# Patient Record
Sex: Female | Born: 1980
Health system: Southern US, Community
[De-identification: ages and names within clinical notes are randomized; demographics above are authoritative.]

---

## 2013-05-01 ENCOUNTER — Ambulatory Visit (INDEPENDENT_AMBULATORY_CARE_PROVIDER_SITE_OTHER): Payer: 59 | Admitting: Obstetrics & Gynecology

## 2013-05-01 ENCOUNTER — Encounter: Payer: Self-pay | Admitting: Obstetrics & Gynecology

## 2013-05-01 VITALS — BP 132/75 | HR 85 | Resp 16 | Ht 65.0 in | Wt 130.0 lb

## 2013-05-01 DIAGNOSIS — Z1151 Encounter for screening for human papillomavirus (HPV): Secondary | ICD-10-CM

## 2013-05-01 DIAGNOSIS — Z113 Encounter for screening for infections with a predominantly sexual mode of transmission: Secondary | ICD-10-CM

## 2013-05-01 DIAGNOSIS — Z124 Encounter for screening for malignant neoplasm of cervix: Secondary | ICD-10-CM

## 2013-05-01 DIAGNOSIS — Z0000 Encounter for general adult medical examination without abnormal findings: Secondary | ICD-10-CM

## 2013-05-01 DIAGNOSIS — Z01419 Encounter for gynecological examination (general) (routine) without abnormal findings: Secondary | ICD-10-CM
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
## 2014-01-21 ENCOUNTER — Encounter (HOSPITAL_COMMUNITY): Payer: Self-pay | Admitting: *Deleted

## 2014-01-21 ENCOUNTER — Encounter (HOSPITAL_COMMUNITY)
Admission: AD | Disposition: A | Discharge status: Home / Self Care | Payer: Self-pay | Source: Ambulatory Visit | Attending: Obstetrics and Gynecology

## 2014-01-21 ENCOUNTER — Encounter (HOSPITAL_COMMUNITY): Payer: 59 | Admitting: Anesthesiology

## 2014-01-21 ENCOUNTER — Inpatient Hospital Stay (HOSPITAL_COMMUNITY)
Admission: AD | Admit: 2014-01-21 | Discharge: 2014-01-24 | DRG: 765 | Disposition: A | Discharge status: Home / Self Care | Payer: 59 | Source: Ambulatory Visit | Attending: Obstetrics and Gynecology | Admitting: Obstetrics and Gynecology

## 2014-01-21 ENCOUNTER — Inpatient Hospital Stay (HOSPITAL_COMMUNITY): Payer: 59 | Admitting: Anesthesiology

## 2014-01-21 DIAGNOSIS — D649 Anemia, unspecified: Secondary | ICD-10-CM | POA: Diagnosis present

## 2014-01-21 DIAGNOSIS — O468X9 Other antepartum hemorrhage, unspecified trimester: Secondary | ICD-10-CM | POA: Diagnosis present

## 2014-01-21 DIAGNOSIS — O4590 Premature separation of placenta, unspecified, unspecified trimester: Principal | ICD-10-CM | POA: Diagnosis present

## 2014-01-21 DIAGNOSIS — O43899 Other placental disorders, unspecified trimester: Secondary | ICD-10-CM

## 2014-01-21 DIAGNOSIS — Z98891 History of uterine scar from previous surgery: Secondary | ICD-10-CM

## 2014-01-21 DIAGNOSIS — O368990 Maternal care for other specified fetal problems, unspecified trimester, not applicable or unspecified: Secondary | ICD-10-CM | POA: Diagnosis present

## 2014-01-21 DIAGNOSIS — O9903 Anemia complicating the puerperium: Secondary | ICD-10-CM | POA: Diagnosis present

## 2014-01-21 LAB — RPR: RPR Ser Ql: NONREACTIVE

## 2014-01-21 LAB — CBC
HCT: 35.1 % — ABNORMAL LOW (ref 36.0–46.0)
Hemoglobin: 12.2 g/dL (ref 12.0–15.0)
MCH: 28.9 pg (ref 26.0–34.0)
MCHC: 34.8 g/dL (ref 30.0–36.0)
MCV: 83.2 fL (ref 78.0–100.0)
PLATELETS: 279 10*3/uL (ref 150–400)
RBC: 4.22 MIL/uL (ref 3.87–5.11)
RDW: 12.6 % (ref 11.5–15.5)
WBC: 12.2 10*3/uL — AB (ref 4.0–10.5)

## 2014-01-21 LAB — TYPE AND SCREEN
ABO/RH(D): O POS
Antibody Screen: NEGATIVE

## 2014-01-21 LAB — DIC (DISSEMINATED INTRAVASCULAR COAGULATION) PANEL
D DIMER QUANT: 1.66 ug{FEU}/mL — AB (ref 0.00–0.48)
FIBRINOGEN: 440 mg/dL (ref 204–475)
PLATELETS: 283 10*3/uL (ref 150–400)
PROTHROMBIN TIME: 12.7 s (ref 11.6–15.2)
SMEAR REVIEW: NONE SEEN

## 2014-01-21 LAB — DIC (DISSEMINATED INTRAVASCULAR COAGULATION)PANEL
INR: 0.97 (ref 0.00–1.49)
aPTT: 25 seconds (ref 24–37)

## 2014-01-21 LAB — ABO/RH: ABO/RH(D): O POS

## 2014-01-21 SURGERY — Surgical Case
Anesthesia: Spinal | Site: Abdomen

## 2014-01-21 MED ORDER — ONDANSETRON HCL 4 MG PO TABS: 4.0000 mg | ORAL_TABLET | ORAL | Status: DC | PRN

## 2014-01-21 MED ORDER — PROMETHAZINE HCL 25 MG/ML IJ SOLN
12.5000 mg | Freq: Once | INTRAMUSCULAR | Status: AC
  Administered 2014-01-21: 12.5 mg via INTRAVENOUS

## 2014-01-21 MED ORDER — PRENATAL MULTIVITAMIN CH
1.0000 | ORAL_TABLET | Freq: Every day | ORAL | Status: DC
  Administered 2014-01-22 – 2014-01-24 (×3): 1 via ORAL
  Filled 2014-01-21 (×3): qty 1

## 2014-01-21 MED ORDER — KETOROLAC TROMETHAMINE 30 MG/ML IJ SOLN
INTRAMUSCULAR | Status: AC
Start: 2014-01-21 — End: 2014-01-22
  Filled 2014-01-21: qty 1

## 2014-01-21 MED ORDER — ONDANSETRON HCL 4 MG/2ML IJ SOLN
INTRAMUSCULAR | Status: AC
  Filled 2014-01-21: qty 2

## 2014-01-21 MED ORDER — ONDANSETRON HCL 4 MG/2ML IJ SOLN
INTRAMUSCULAR | Status: DC | PRN
  Administered 2014-01-21: 4 mg via INTRAVENOUS

## 2014-01-21 MED ORDER — MORPHINE SULFATE 0.5 MG/ML IJ SOLN
INTRAMUSCULAR | Status: AC
  Filled 2014-01-21: qty 10

## 2014-01-21 MED ORDER — FENTANYL CITRATE 0.05 MG/ML IJ SOLN: 25.0000 ug | INTRAMUSCULAR | Status: DC | PRN

## 2014-01-21 MED ORDER — TETANUS-DIPHTH-ACELL PERTUSSIS 5-2.5-18.5 LF-MCG/0.5 IM SUSP: 0.5000 mL | Freq: Once | INTRAMUSCULAR | Status: DC

## 2014-01-21 MED ORDER — FENTANYL CITRATE 0.05 MG/ML IJ SOLN
INTRAMUSCULAR | Status: AC
  Filled 2014-01-21: qty 2

## 2014-01-21 MED ORDER — OXYTOCIN 40 UNITS IN LACTATED RINGERS INFUSION - SIMPLE MED: 62.5000 mL/h | INTRAVENOUS | Status: AC

## 2014-01-21 MED ORDER — SCOPOLAMINE 1 MG/3DAYS TD PT72
1.0000 | MEDICATED_PATCH | Freq: Once | TRANSDERMAL | Status: DC
  Administered 2014-01-21: 1.5 mg via TRANSDERMAL

## 2014-01-21 MED ORDER — SIMETHICONE 80 MG PO CHEW
80.0000 mg | CHEWABLE_TABLET | Freq: Three times a day (TID) | ORAL | Status: DC
  Administered 2014-01-22 – 2014-01-24 (×7): 80 mg via ORAL
  Filled 2014-01-21 (×7): qty 1

## 2014-01-21 MED ORDER — MORPHINE SULFATE (PF) 0.5 MG/ML IJ SOLN
INTRAMUSCULAR | Status: DC | PRN
  Administered 2014-01-21: 15 mg via INTRATHECAL

## 2014-01-21 MED ORDER — MENTHOL 3 MG MT LOZG: 1.0000 | LOZENGE | OROMUCOSAL | Status: DC | PRN

## 2014-01-21 MED ORDER — DIPHENHYDRAMINE HCL 50 MG/ML IJ SOLN: 12.5000 mg | INTRAMUSCULAR | Status: DC | PRN

## 2014-01-21 MED ORDER — LACTATED RINGERS IV BOLUS (SEPSIS)
1000.0000 mL | Freq: Once | INTRAVENOUS | Status: AC
Start: 2014-01-21 — End: 2014-01-21
  Administered 2014-01-21: 1000 mL via INTRAVENOUS

## 2014-01-21 MED ORDER — OXYTOCIN 40 UNITS IN LACTATED RINGERS INFUSION - SIMPLE MED
INTRAVENOUS | Status: DC | PRN
  Administered 2014-01-21: 40 [IU] via INTRAVENOUS

## 2014-01-21 MED ORDER — FENTANYL CITRATE 0.05 MG/ML IJ SOLN
INTRAMUSCULAR | Status: DC | PRN
Start: 2014-01-21 — End: 2014-01-21
  Administered 2014-01-21: 25 ug via INTRATHECAL

## 2014-01-21 MED ORDER — SCOPOLAMINE 1 MG/3DAYS TD PT72
MEDICATED_PATCH | TRANSDERMAL | Status: AC
  Filled 2014-01-21: qty 1

## 2014-01-21 MED ORDER — KETOROLAC TROMETHAMINE 30 MG/ML IJ SOLN: 30.0000 mg | Freq: Four times a day (QID) | INTRAMUSCULAR | Status: DC | PRN

## 2014-01-21 MED ORDER — ONDANSETRON HCL 4 MG/2ML IJ SOLN
4.0000 mg | INTRAMUSCULAR | Status: DC | PRN
  Administered 2014-01-21: 4 mg via INTRAVENOUS

## 2014-01-21 MED ORDER — LACTATED RINGERS IV SOLN
INTRAVENOUS | Status: DC | PRN
Start: 2014-01-21 — End: 2014-01-21
  Administered 2014-01-21 (×2): via INTRAVENOUS

## 2014-01-21 MED ORDER — BETAMETHASONE SOD PHOS & ACET 6 (3-3) MG/ML IJ SUSP
12.0000 mg | Freq: Once | INTRAMUSCULAR | Status: AC
  Administered 2014-01-21: 12 mg via INTRAMUSCULAR
  Filled 2014-01-21: qty 2

## 2014-01-21 MED ORDER — PHENYLEPHRINE 8 MG IN D5W 100 ML (0.08MG/ML) PREMIX OPTIME
INJECTION | INTRAVENOUS | Status: AC
  Filled 2014-01-21: qty 100

## 2014-01-21 MED ORDER — OXYTOCIN 10 UNIT/ML IJ SOLN
INTRAMUSCULAR | Status: AC
  Filled 2014-01-21: qty 4

## 2014-01-21 MED ORDER — CITRIC ACID-SODIUM CITRATE 334-500 MG/5ML PO SOLN
ORAL | Status: DC | PRN
  Administered 2014-01-21: 30 mL via ORAL

## 2014-01-21 MED ORDER — NIFEDIPINE 10 MG PO CAPS: 10.0000 mg | ORAL_CAPSULE | Freq: Once | ORAL | Status: DC

## 2014-01-21 MED ORDER — ZOLPIDEM TARTRATE 5 MG PO TABS: 5.0000 mg | ORAL_TABLET | Freq: Every evening | ORAL | Status: DC | PRN

## 2014-01-21 MED ORDER — CEFAZOLIN SODIUM-DEXTROSE 2-3 GM-% IV SOLR
INTRAVENOUS | Status: AC
  Filled 2014-01-21: qty 50

## 2014-01-21 MED ORDER — MEPERIDINE HCL 25 MG/ML IJ SOLN
INTRAMUSCULAR | Status: AC
Start: 2014-01-21 — End: 2014-01-22
  Filled 2014-01-21: qty 1

## 2014-01-21 MED ORDER — LACTATED RINGERS IV SOLN: INTRAVENOUS | Status: DC

## 2014-01-21 MED ORDER — LACTATED RINGERS IV BOLUS (SEPSIS): 1000.0000 mL | Freq: Once | INTRAVENOUS | Status: DC

## 2014-01-21 MED ORDER — BUPIVACAINE IN DEXTROSE 0.75-8.25 % IT SOLN
INTRATHECAL | Status: DC | PRN
  Administered 2014-01-21: 1.6 mL via INTRATHECAL

## 2014-01-21 MED ORDER — IBUPROFEN 600 MG PO TABS
600.0000 mg | ORAL_TABLET | Freq: Four times a day (QID) | ORAL | Status: DC
  Administered 2014-01-22 – 2014-01-24 (×9): 600 mg via ORAL
  Filled 2014-01-21 (×9): qty 1

## 2014-01-21 MED ORDER — ONDANSETRON HCL 4 MG/2ML IJ SOLN: 4.0000 mg | Freq: Three times a day (TID) | INTRAMUSCULAR | Status: DC | PRN

## 2014-01-21 MED ORDER — NALOXONE HCL 0.4 MG/ML IJ SOLN: 0.4000 mg | INTRAMUSCULAR | Status: DC | PRN

## 2014-01-21 MED ORDER — DIPHENHYDRAMINE HCL 25 MG PO CAPS: 25.0000 mg | ORAL_CAPSULE | ORAL | Status: DC | PRN

## 2014-01-21 MED ORDER — NALBUPHINE HCL 10 MG/ML IJ SOLN: 5.0000 mg | INTRAMUSCULAR | Status: DC | PRN

## 2014-01-21 MED ORDER — PROMETHAZINE HCL 25 MG/ML IJ SOLN
INTRAMUSCULAR | Status: AC
  Filled 2014-01-21: qty 1

## 2014-01-21 MED ORDER — PHENYLEPHRINE 40 MCG/ML (10ML) SYRINGE FOR IV PUSH (FOR BLOOD PRESSURE SUPPORT)
PREFILLED_SYRINGE | INTRAVENOUS | Status: AC
  Filled 2014-01-21: qty 5

## 2014-01-21 MED ORDER — OXYCODONE-ACETAMINOPHEN 5-325 MG PO TABS: 1.0000 | ORAL_TABLET | ORAL | Status: DC | PRN

## 2014-01-21 MED ORDER — CITRIC ACID-SODIUM CITRATE 334-500 MG/5ML PO SOLN
ORAL | Status: AC
  Filled 2014-01-21: qty 15

## 2014-01-21 MED ORDER — WITCH HAZEL-GLYCERIN EX PADS: 1.0000 "application " | MEDICATED_PAD | CUTANEOUS | Status: DC | PRN

## 2014-01-21 MED ORDER — METOCLOPRAMIDE HCL 5 MG/ML IJ SOLN
INTRAMUSCULAR | Status: AC
  Filled 2014-01-21: qty 2

## 2014-01-21 MED ORDER — CEFAZOLIN SODIUM-DEXTROSE 2-3 GM-% IV SOLR
2.0000 g | Freq: Once | INTRAVENOUS | Status: AC
  Administered 2014-01-21: 2 g via INTRAVENOUS

## 2014-01-21 MED ORDER — DIPHENHYDRAMINE HCL 25 MG PO CAPS: 25.0000 mg | ORAL_CAPSULE | Freq: Four times a day (QID) | ORAL | Status: DC | PRN

## 2014-01-21 MED ORDER — SENNOSIDES-DOCUSATE SODIUM 8.6-50 MG PO TABS
2.0000 | ORAL_TABLET | ORAL | Status: DC
  Administered 2014-01-21 – 2014-01-22 (×2): 2 via ORAL
  Filled 2014-01-21 (×3): qty 2

## 2014-01-21 MED ORDER — SCOPOLAMINE 1 MG/3DAYS TD PT72: 1.0000 | MEDICATED_PATCH | Freq: Once | TRANSDERMAL | Status: DC

## 2014-01-21 MED ORDER — METOCLOPRAMIDE HCL 5 MG/ML IJ SOLN
10.0000 mg | Freq: Three times a day (TID) | INTRAMUSCULAR | Status: DC | PRN
  Administered 2014-01-21: 10 mg via INTRAVENOUS

## 2014-01-21 MED ORDER — ONDANSETRON HCL 4 MG/2ML IJ SOLN
INTRAMUSCULAR | Status: AC
Start: 2014-01-21 — End: 2014-01-22
  Filled 2014-01-21: qty 2

## 2014-01-21 MED ORDER — MEPERIDINE HCL 25 MG/ML IJ SOLN
6.2500 mg | INTRAMUSCULAR | Status: DC | PRN
Start: 2014-01-21 — End: 2014-01-21
  Administered 2014-01-21: 6.25 mg via INTRAVENOUS

## 2014-01-21 MED ORDER — SIMETHICONE 80 MG PO CHEW
80.0000 mg | CHEWABLE_TABLET | ORAL | Status: DC | PRN
  Filled 2014-01-21: qty 1

## 2014-01-21 MED ORDER — LANOLIN HYDROUS EX OINT: 1.0000 "application " | TOPICAL_OINTMENT | CUTANEOUS | Status: DC | PRN

## 2014-01-21 MED ORDER — KETOROLAC TROMETHAMINE 30 MG/ML IJ SOLN
30.0000 mg | Freq: Four times a day (QID) | INTRAMUSCULAR | Status: DC | PRN
  Administered 2014-01-21: 30 mg via INTRAVENOUS

## 2014-01-21 MED ORDER — SIMETHICONE 80 MG PO CHEW
80.0000 mg | CHEWABLE_TABLET | ORAL | Status: DC
Start: 2014-01-22 — End: 2014-01-24
  Administered 2014-01-21 – 2014-01-23 (×3): 80 mg via ORAL
  Filled 2014-01-21 (×3): qty 1

## 2014-01-21 MED ORDER — DIPHENHYDRAMINE HCL 50 MG/ML IJ SOLN: 25.0000 mg | INTRAMUSCULAR | Status: DC | PRN

## 2014-01-21 MED ORDER — SODIUM CHLORIDE 0.9 % IJ SOLN: 3.0000 mL | INTRAMUSCULAR | Status: DC | PRN

## 2014-01-21 MED ORDER — DIBUCAINE 1 % RE OINT: 1.0000 "application " | TOPICAL_OINTMENT | RECTAL | Status: DC | PRN

## 2014-01-21 MED ORDER — KETOROLAC TROMETHAMINE 30 MG/ML IJ SOLN
30.0000 mg | Freq: Four times a day (QID) | INTRAMUSCULAR | Status: DC | PRN
Start: 2014-01-21 — End: 2014-01-21

## 2014-01-21 MED ORDER — DEXTROSE 5 % IV SOLN
1.0000 ug/kg/h | INTRAVENOUS | Status: DC | PRN
  Filled 2014-01-21: qty 2

## 2014-01-21 MED ORDER — PHENYLEPHRINE 8 MG IN D5W 100 ML (0.08MG/ML) PREMIX OPTIME
INJECTION | INTRAVENOUS | Status: DC | PRN
  Administered 2014-01-21: 60 ug/min via INTRAVENOUS

## 2014-01-21 MED ORDER — LACTATED RINGERS IV SOLN
INTRAVENOUS | Status: DC | PRN
Start: 2014-01-21 — End: 2014-01-21
  Administered 2014-01-21: 18:00:00 via INTRAVENOUS

## 2014-01-21 SURGICAL SUPPLY — 34 items
BENZOIN TINCTURE PRP APPL 2/3 (GAUZE/BANDAGES/DRESSINGS) ×2 IMPLANT
CLAMP CORD UMBIL (MISCELLANEOUS) IMPLANT
CLOTH BEACON ORANGE TIMEOUT ST (SAFETY) ×2 IMPLANT
CONTAINER PREFILL 10% NBF 15ML (MISCELLANEOUS) IMPLANT
DERMABOND ADVANCED (GAUZE/BANDAGES/DRESSINGS) ×1
DERMABOND ADVANCED .7 DNX12 (GAUZE/BANDAGES/DRESSINGS) ×1 IMPLANT
DRAPE LG THREE QUARTER DISP (DRAPES) IMPLANT
DRSG OPSITE POSTOP 4X10 (GAUZE/BANDAGES/DRESSINGS) ×2 IMPLANT
DURAPREP 26ML APPLICATOR (WOUND CARE) ×2 IMPLANT
ELECT REM PT RETURN 9FT ADLT (ELECTROSURGICAL) ×2
ELECTRODE REM PT RTRN 9FT ADLT (ELECTROSURGICAL) ×1 IMPLANT
EXTRACTOR VACUUM M CUP 4 TUBE (SUCTIONS) IMPLANT
GLOVE BIO SURGEON STRL SZ7.5 (GLOVE) ×2 IMPLANT
GLOVE BIOGEL PI IND STRL 7.5 (GLOVE) ×1 IMPLANT
GLOVE BIOGEL PI INDICATOR 7.5 (GLOVE) ×1
GOWN STRL REUS W/TWL LRG LVL3 (GOWN DISPOSABLE) ×4 IMPLANT
KIT ABG SYR 3ML LUER SLIP (SYRINGE) IMPLANT
NEEDLE HYPO 25X5/8 SAFETYGLIDE (NEEDLE) IMPLANT
NS IRRIG 1000ML POUR BTL (IV SOLUTION) ×2 IMPLANT
PACK C SECTION WH (CUSTOM PROCEDURE TRAY) ×2 IMPLANT
PAD OB MATERNITY 4.3X12.25 (PERSONAL CARE ITEMS) ×2 IMPLANT
RETRACTOR WND ALEXIS 25 LRG (MISCELLANEOUS) ×1 IMPLANT
RTRCTR WOUND ALEXIS 25CM LRG (MISCELLANEOUS) ×2
STRIP CLOSURE SKIN 1/2X4 (GAUZE/BANDAGES/DRESSINGS) ×2 IMPLANT
SUT CHROMIC 2 0 CT 1 (SUTURE) ×2 IMPLANT
SUT MNCRL AB 3-0 PS2 27 (SUTURE) ×2 IMPLANT
SUT PLAIN 0 NONE (SUTURE) IMPLANT
SUT PLAIN 2 0 XLH (SUTURE) ×2 IMPLANT
SUT VIC AB 0 CT1 36 (SUTURE) ×2 IMPLANT
SUT VIC AB 0 CTX 36 (SUTURE) ×3
SUT VIC AB 0 CTX36XBRD ANBCTRL (SUTURE) ×3 IMPLANT
TOWEL OR 17X24 6PK STRL BLUE (TOWEL DISPOSABLE) ×2 IMPLANT
TRAY FOLEY CATH 14FR (SET/KITS/TRAYS/PACK) ×2 IMPLANT
WATER STERILE IRR 1000ML POUR (IV SOLUTION) ×2 IMPLANT

## 2014-01-21 NOTE — Transfer of Care (Signed)
Immediate Anesthesia Transfer of Care Note  Patient: Diana Stanley  Procedure(s) Performed: Procedure(s):    STAT CESAREAN SECTION (N/A)  Patient Location: PACU  Anesthesia Type:Spinal  Level of Consciousness: awake  Airway & Oxygen Therapy: Patient Spontanous Breathing  Post-op Assessment: Report given to PACU RN and Post -op Vital signs reviewed and stable  Post vital signs: stable  Complications: No apparent anesthesia complications

## 2014-01-21 NOTE — Op Note (Addendum)
Cesarean Section Procedure Note  Indications: P2 at 33 5/7 wks with third trimester bleeding, fetal heart rate deceleration and placental abruption.  Pre-operative Diagnosis: 1.33 5/7 wks 2.Placenta Ruptured 3.Third Trimester Bleeding 4. Fetal Decelerations   Post-operative Diagnosis: 1.33 5/7 wks 2.Placental Ruptured 3.Third Trimester Bleeding 4.Fetal Decelerations  Procedure: PRIMARY LOW TRANSVERSE CESAREAN SECTION  Surgeon: Purcell NailsAngela Y Muzamil Harker, MD    Assistants: Gerrit HeckJessica Emly, CNM  Anesthesia: Regional  Anesthesiologist: Tyrone AppleMichael A. Malen GauzeFoster, MD   Procedure Details  The patient was taken to the operating room emergently secondary to placental abruption with fetal decelerations after the risks, benefits, complications, treatment options, and expected outcomes were discussed with the patient.  The patient concurred with the proposed plan, giving informed consent which was signed and witnessed. The patient was taken to Operating Room C-Section Suite, identified as Nicanor AlconRuth Lonon and the procedure verified as C-Section Delivery. A Time Out was held and the above information confirmed.  After induction of anesthesia by obtaining a spinal, the patient was prepped and draped in the usual sterile manner. A Pfannenstiel skin incision was made and carried down through the subcutaneous tissue to the underlying layer of fascia.  The fascia was incised bilaterally and extended transversely bilaterally with the Mayo scissors. Kocher clamps were placed on the inferior aspect of the fascial incision and the underlying rectus muscle was separated from the fascia. The same was done on the superior aspect of the fascial incision.  The peritoneum was identified, entered bluntly and extended manually.  An Alexis self-retaining retractor was placed.  The utero-vesical peritoneal reflection was incised transversely and the bladder flap was bluntly freed from the lower uterine segment. A low transverse uterine incision was  made with the scalpel and extended bilaterally with the bandage scissors.  The infant was delivered in vertex position without difficulty behind a large retroplacental clot delivered.  After the umbilical cord was clamped and cut, the infant was handed to the awaiting pediatricians.  Cord blood was obtained for evaluation.  The placenta was removed intact and appeared to be within normal limits. The uterus was cleared of all clots and debris. The uterine incision was closed with running interlocking sutures of 0 Vicryl and a second imbricating layer was performed as well.   Bilateral tubes and ovaries appeared to be within normal limits.  Good hemostasis was noted.  Copious irrigation was performed until clear.  The peritoneum was repaired with 2-0 chromic via a running suture.  The fascia was reapproximated with a running suture of 0 Vicryl. The subcutaneous tissue was reapproximated with 3 interrupted sutures of 2-0 plain.  The skin was reapproximated with a subcuticular suture of 3-0 monocryl.  Steristrips were applied.  Instrument, sponge, and needle counts were correct prior to abdominal closure and at the conclusion of the case.  The patient was awaiting transfer to the recovery room in good condition.  Findings: Live female infant with Apgars per NICU team.  Cord ph A 7.10 V 7.09. Normal appearing bilateral ovaries and fallopian tubes were noted.  Estimated Blood Loss:  700 ml         Drains: foley to gravity with clear fluid (see flowsheet)         Total IV Fluids: See flowsheet         Specimens to Pathology: Placenta          Complications:  None; patient tolerated the procedure well.         Disposition: PACU - hemodynamically stable.  Condition: stable  Attending Attestation: I performed the procedure.

## 2014-01-21 NOTE — Anesthesia Postprocedure Evaluation (Signed)
  Anesthesia Post-op Note  Anesthesia Post Note  Patient: Diana AlconRuth Hassell  Procedure(s) Performed: Procedure(s) (LRB):    STAT CESAREAN SECTION (N/A)  Anesthesia type: Spinal  Patient location: PACU  Post pain: Pain level controlled  Post assessment: Post-op Vital signs reviewed  Last Vitals:  Filed Vitals:   01/21/14 2145  BP: 120/71  Pulse: 94  Temp:   Resp: 23    Post vital signs: Reviewed  Level of consciousness: awake  Complications: No apparent anesthesia complications

## 2014-01-21 NOTE — H&P (Signed)
Nicanor AlconRuth Angelo is a 33 y.o. female, G2P1001 at 33.5 weeks, presenting with third trimester bleeding. Patient reports that she noticed bleeding while using the bathroom.  Patient was at work at the time and reports minor cramping but no contractions.  She denies LOF, but noted decreased FM prior to the incident.  Patient states that she put on a pad prior to leaving work [facility]([place]) which was completely saturated on arrival.  Patient passing large clots upon removal of pad upon arrival to MAU.    There are no active problems to display for this patient.   History of present pregnancy: Patient entered care at 10.6 weeks.   EDC of 03/06/2014 was established by Definite LMP of 05/30/2013.   Anatomy scan:  18.5 weeks, with normal findings and an posterior placenta.   Additional US evaluations: None   Significant prenatal events:  None   Last evaluation:  01/09/2014  OB History   Grav Para Term Preterm Abortions TAB SAB Ect Mult Living   2 2 1 1      2      No past medical history on file. No past surgical history on file. Family History: family history includes Hypertension in her father; Ovarian cancer in her maternal grandmother. Social History:  reports that she has never smoked. She has never used smokeless tobacco. She reports that she drinks about 0.5 ounces of alcohol per week. She reports that she does not use illicit drugs.   Prenatal Transfer Tool  Maternal Diabetes: No Genetic Screening: Normal Maternal Ultrasounds/Referrals: Normal Fetal Ultrasounds or other Referrals:  None Maternal Substance Abuse:  No Significant Maternal Medications:  None Significant Maternal Lab Results: None    ROS:  +VB, Unsure of LOF, Decreased FM, +Cramping  No Known Allergies     Last menstrual period 04/10/2013, unknown if currently breastfeeding.  Chest clear Heart RRR without murmur Abd gravid, NT Pelvic: Spec exam - with pooling of blood in vagina.  After clearing this out, there was  trickling of blood from the cervical os.   Ext: WNL  FHR: cat 2 UCs:  q171min  Prenatal labs: ABO, Rh: --/--/O POS, O POS (04/01 1730) Antibody: NEG (04/01 1730) Rubella:   Immune RPR:   NR HBsAg:   Negative HIV:   Negative GBS:  Unknown Sickle cell/Hgb electrophoresis:  N/A GC:  Negative Chlamydia:  Negative Genetic screenings:  Normal Glucola:  114 Other:  N/A    Assessment/Plan: IUP at 4133 5/7wks with third trimester bleeding, placental abruption and fetal decelerations.  Pt informed of the situation and emergent c-section recommended.  Pt is in agreement.  The risks, benefits and alternatives were discussed and consent signed and witnessed.  Questions answered.      Phillips ClimesMLY, JESSICA LYNNCNM, MSN 01/21/2014, 7:07 PM

## 2014-01-21 NOTE — MAU Note (Signed)
Patient presented to MAU with c/o sudden onset of vaginal bleeding. Patient is actively bleeding on arrival to MAU. Pad is completely saturated with bright blood and passing clots. Monitor applied and CNM and Dr. Su Hiltoberts notified. IV started with bolus and labs drawn from cath. Dr. Su Hiltoberts at bedside and speculum exam done. Patient continues to have moderate bleeding. Monitor  indicating frequent contractions with fetal heart rate int 150's.

## 2014-01-21 NOTE — Anesthesia Procedure Notes (Signed)
Spinal  Patient location during procedure: OR Start time: 01/21/2014 5:55 PM Staffing Anesthesiologist: Marzell Isakson A. Performed by: anesthesiologist  Preanesthetic Checklist Completed: patient identified, site marked, surgical consent, pre-op evaluation, timeout performed, IV checked, risks and benefits discussed and monitors and equipment checked Spinal Block Patient position: sitting Prep: site prepped and draped and DuraPrep Patient monitoring: heart rate, cardiac monitor, continuous pulse ox and blood pressure Approach: midline Location: L3-4 Injection technique: single-shot Needle Needle type: Sprotte  Needle gauge: 24 G Needle length: 9 cm Needle insertion depth: 5 cm Assessment Sensory level: T4

## 2014-01-21 NOTE — Anesthesia Preprocedure Evaluation (Signed)
Anesthesia Evaluation  Patient identified by MRN, date of birth, ID band Patient awake    Reviewed: Allergy & Precautions, H&P , NPO status , Patient's Chart, lab work & pertinent test results  Airway Mallampati: I TM Distance: >3 FB Neck ROM: Full    Dental no notable dental hx. (+) Teeth Intact   Pulmonary neg pulmonary ROS,  breath sounds clear to auscultation  Pulmonary exam normal       Cardiovascular negative cardio ROS  Rhythm:Regular Rate:Normal     Neuro/Psych negative neurological ROS  negative psych ROS   GI/Hepatic negative GI ROS, Neg liver ROS,   Endo/Other  negative endocrine ROS  Renal/GU negative Renal ROS  negative genitourinary   Musculoskeletal negative musculoskeletal ROS (+)   Abdominal   Peds  Hematology negative hematology ROS (+)   Anesthesia Other Findings   Reproductive/Obstetrics (+) Pregnancy 33 4/7 weeks Suspected Placental abruption                           Anesthesia Physical Anesthesia Plan  ASA: II and emergent  Anesthesia Plan: Spinal   Post-op Pain Management:    Induction:   Airway Management Planned: Natural Airway  Additional Equipment:   Intra-op Plan:   Post-operative Plan:   Informed Consent: I have reviewed the patients History and Physical, chart, labs and discussed the procedure including the risks, benefits and alternatives for the proposed anesthesia with the patient or authorized representative who has indicated his/her understanding and acceptance.     Plan Discussed with: Anesthesiologist, CRNA and Surgeon  Anesthesia Plan Comments:         Anesthesia Quick Evaluation

## 2014-01-22 ENCOUNTER — Encounter (HOSPITAL_COMMUNITY): Payer: Self-pay | Admitting: Obstetrics and Gynecology

## 2014-01-22 LAB — CBC
HCT: 26.9 % — ABNORMAL LOW (ref 36.0–46.0)
HEMOGLOBIN: 9.3 g/dL — AB (ref 12.0–15.0)
MCH: 28.9 pg (ref 26.0–34.0)
MCHC: 34.6 g/dL (ref 30.0–36.0)
MCV: 83.5 fL (ref 78.0–100.0)
Platelets: 215 10*3/uL (ref 150–400)
RBC: 3.22 MIL/uL — AB (ref 3.87–5.11)
RDW: 12.4 % (ref 11.5–15.5)
WBC: 17.4 10*3/uL — AB (ref 4.0–10.5)

## 2014-01-22 MED ORDER — FERROUS SULFATE 325 (65 FE) MG PO TABS
325.0000 mg | ORAL_TABLET | Freq: Three times a day (TID) | ORAL | Status: DC
  Administered 2014-01-22 – 2014-01-24 (×6): 325 mg via ORAL
  Filled 2014-01-22 (×6): qty 1

## 2014-01-22 NOTE — Addendum Note (Signed)
Addendum created 01/22/14 1653 by Algis GreenhouseLinda A Nylee Barbuto, CRNA   Modules edited: Notes Section   Notes Section:  File: [id number]

## 2014-01-22 NOTE — Lactation Note (Signed)
This note was copied from the chart of Diana Nicanor AlconRuth Milne. Lactation Consultation Note    Initial consult with this mom of a NICU baby, now 18 hours post partum and 33 6/7 weeks corrected gestation. Mom has already pumped 3 times, and has brought milk to her baby. She has placental abrption and c/section. Baby on CPAP and 21 % oxygen. Mom is familiar with pumping, since she did so with her first, when she went back to work. Mom has a DEP at home. Basic pumping and hand expression teaching done. Mom knows to call for quetsion/concerns.  Patient Name: Diana Stanley Reason for consult: Initial assessment   Maternal Data Formula Feeding for Exclusion: Yes (baby in NICU) Infant to breast within first hour of birth: No Breastfeeding delayed due to:: Infant status;Maternal status (placental abruption) Has patient been taught Hand Expression?: Yes Does the patient have breastfeeding experience prior to this delivery?: Yes  Feeding    LATCH Score/Interventions                      Lactation Tools Discussed/Used Tools: Pump Breast pump type: Double-Electric Breast Pump WIC Program: No Pump Review: Setup, frequency, and cleaning;Milk Storage;Other (comment) (hand expression, review of NICU boklet on providing milk, premie setting) Initiated by:: bedsie RN wiothing 3 hours of delivery Date initiated:: 01/21/14   Consult Status Consult Status: Follow-up Date: 01/23/14 Follow-up type: In-patient    Diana Stanley, Diana Stanley Stanley, 12:20 PM

## 2014-01-22 NOTE — Anesthesia Postprocedure Evaluation (Signed)
Anesthesia Post Note  Patient: Diana Stanley  Procedure(s) Performed: Procedure(s) (LRB):    STAT CESAREAN SECTION (N/A)  Anesthesia type: Spinal  Patient location: Mother/Baby  Post pain: Pain level controlled  Post assessment: Post-op Vital signs reviewed  Last Vitals:  Filed Vitals:   01/22/14 1530  BP: 112/60  Pulse: 69  Temp: 36.8 C  Resp: 18    Post vital signs: Reviewed  Level of consciousness: awake  Complications: No apparent anesthesia complications

## 2014-01-22 NOTE — Progress Notes (Signed)
Subjective: Postpartum Day 1: Cesarean Delivery due to Placental Abruption Patient up ad lib, reports no syncope or dizziness. Reports minimal pain  Feeding:  Breast/Pumping Contraceptive plan:  Unknown  Objective: Vital signs in last 24 hours: Temp:  [97.4 F (36.3 C)-98.9 F (37.2 C)] 98.9 F (37.2 C) (04/02 0937) Pulse Rate:  [61-107] 74 (04/02 0937) Resp:  [16-73] 18 (04/02 0937) BP: (100-126)/(58-75) 101/58 mmHg (04/02 0937) SpO2:  [96 %-100 %] 100 % (04/02 0937) Weight:  [145 lb (65.772 kg)] 145 lb (65.772 kg) (04/01 2302)  Physical Exam:  General: alert, cooperative and no distress Lochia: appropriate Uterine Fundus: firm, nontender, U/-2 Incision: no significant drainage, Honeycomb dressing CDI DVT Evaluation: No evidence of DVT seen on physical exam. Negative Homan's sign. No significant calf/ankle edema. JP drain:  N/A   Recent Labs  01/21/14 1730 01/22/14 0542  HGB 12.2 9.3*  HCT 35.1* 26.9*  WBC 12.2* 17.4*    Assessment S/P Cesarean Section Day #1 Stable & Doing Well Asymptomatic Anemia  Plan: -Orthostatic Vital Signs -Fe+ supplementation 325mg  TID -CBC in am to reassess stability -Continue current care -Dr. Cloretta NedS.Rivard updated on patient status     Alayne Estrella LYNN 01/22/2014, 2:34 PM

## 2014-01-23 LAB — CBC
HCT: 24.1 % — ABNORMAL LOW (ref 36.0–46.0)
HEMOGLOBIN: 8.1 g/dL — AB (ref 12.0–15.0)
MCH: 28.7 pg (ref 26.0–34.0)
MCHC: 33.6 g/dL (ref 30.0–36.0)
MCV: 85.5 fL (ref 78.0–100.0)
Platelets: 200 10*3/uL (ref 150–400)
RBC: 2.82 MIL/uL — ABNORMAL LOW (ref 3.87–5.11)
RDW: 12.7 % (ref 11.5–15.5)
WBC: 13.9 10*3/uL — ABNORMAL HIGH (ref 4.0–10.5)

## 2014-01-23 NOTE — Lactation Note (Signed)
This note was copied from the chart of Diana Nicanor AlconRuth Blando. Lactation Consultation Note     Follow up consult with this mom of a NICU baby, now 42 hours post partum, and baby is now 34 weeks corrected gestation. Mom is pumping and has an increasing supply - about 15 mls per pumping. I advised her to pump in standard setting now, and to pump 15-30 minutes, until she stops dripping. Mom denies needing andy assistance at this time, nor having any discomfort with pumping. I will follow this mom in the nICU.  Patient Name: Diana Stanley Reason for consult: Follow-up assessment;NICU baby;Late preterm infant   Maternal Data    Feeding    LATCH Score/Interventions                      Lactation Tools Discussed/Used     Consult Status Consult Status: Follow-up Follow-up type: In-patient (prn in NICU)    Alfred LevinsLee, Khristen Cheyney Anne Stanley, 12:03 PM

## 2014-01-23 NOTE — Progress Notes (Signed)
Nicanor Alconuth Jambor  Post Partum Day 2: S/P Cesarean Delivery d/t placental abruption  Subjective: Patient up ad lib, denies syncope, dizziness, or HA. Feeding:  Pumping, going well Contraceptive plan:   unknown  Objective: Blood pressure 124/68, pulse 91, temperature 97.6 F (36.4 C), temperature source Oral, resp. rate 20, height 5\' 5"  (1.651 m), weight 145 lb (65.772 kg), last menstrual period 04/10/2013, SpO2 99.00%, unknown if currently breastfeeding.  Physical Exam:  General: alert, cooperative and no distress Lochia: appropriate Uterine Fundus: firm Incision: healing well DVT Evaluation: No evidence of DVT seen on physical exam. Negative Homan's sign.   Recent Labs  01/22/14 0542 01/23/14 0525  HGB 9.3* 8.1*  HCT 26.9* 24.1*    Assessment S/P Vaginal delivery day 2 Asymptomatic anemia Pt reports baby is stable  Plan for discharge tomorrow   Haroldine LawsOXLEY, Erina Hamme, CNM 01/23/2014, 12:18 PM

## 2014-01-24 MED ORDER — IBUPROFEN 600 MG PO TABS: 600.0000 mg | ORAL_TABLET | Freq: Four times a day (QID) | ORAL | Status: DC

## 2014-01-24 MED ORDER — OXYCODONE-ACETAMINOPHEN 5-325 MG PO TABS: 1.0000 | ORAL_TABLET | ORAL | Status: DC | PRN

## 2014-01-24 MED ORDER — FERROUS SULFATE 325 (65 FE) MG PO TABS: 325.0000 mg | ORAL_TABLET | Freq: Two times a day (BID) | ORAL | Status: DC

## 2014-01-24 NOTE — Discharge Instructions (Signed)
Postpartum Care After Cesarean Delivery °After you deliver your newborn (postpartum period), the usual stay in the hospital is 24 72 hours. If there were problems with your labor or delivery, or if you have other medical problems, you might be in the hospital longer.  °While you are in the hospital, you will receive help and instructions on how to care for yourself and your newborn during the postpartum period.  °While you are in the hospital: °· It is normal for you to have pain or discomfort from the incision in your abdomen. Be sure to tell your nurses when you are having pain, where the pain is located, and what makes the pain worse. °· If you are breastfeeding, you may feel uncomfortable contractions of your uterus for a couple of weeks. This is normal. The contractions help your uterus get back to normal size. °· It is normal to have some bleeding after delivery. °· For the first 1 3 days after delivery, the flow is red and the amount may be similar to a period. °· It is common for the flow to start and stop. °· In the first few days, you may pass some small clots. Let your nurses know if you begin to pass large clots or your flow increases. °· Do not  flush blood clots down the toilet before having the nurse look at them. °· During the next 3 10 days after delivery, your flow should become more watery and pink or brown-tinged in color. °· Ten to fourteen days after delivery, your flow should be a small amount of yellowish-white discharge. °· The amount of your flow will decrease over the first few weeks after delivery. Your flow may stop in 6 8 weeks. Most women have had their flow stop by 12 weeks after delivery. °· You should change your sanitary pads frequently. °· Wash your hands thoroughly with soap and water for at least 20 seconds after changing pads, using the toilet, or before holding or feeding your newborn. °· Your intravenous (IV) tubing will be removed when you are drinking enough fluids. °· The  urine drainage tube (urinary catheter) that was inserted before delivery may be removed within 6 8 hours after delivery or when feeling returns to your legs. You should feel like you need to empty your bladder within the first 6 8 hours after the catheter has been removed. °· In case you become weak, lightheaded, or faint, call your nurse before you get out of bed for the first time and before you take a shower for the first time. °· Within the first few days after delivery, your breasts may begin to feel tender and full. This is called engorgement. Breast tenderness usually goes away within 48 72 hours after engorgement occurs. You may also notice milk leaking from your breasts. If you are not breastfeeding, do not stimulate your breasts. Breast stimulation can make your breasts produce more milk. °· Spending as much time as possible with your newborn is very important. During this time, you and your newborn can feel close and get to know each other. Having your newborn stay in your room (rooming in) will help to strengthen the bond with your newborn. It will give you time to get to know your newborn and become comfortable caring for your newborn. °· Your hormones change after delivery. Sometimes the hormone changes can temporarily cause you to feel sad or tearful. These feelings should not last more than a few days. If these feelings last longer   than that, you should talk to your caregiver.  If desired, talk to your caregiver about methods of family planning or contraception.  Talk to your caregiver about immunizations. Your caregiver may want you to have the following immunizations before leaving the hospital:  Tetanus, diphtheria, and pertussis (Tdap) or tetanus and diphtheria (Td) immunization. It is very important that you and your family (including grandparents) or others caring for your newborn are up-to-date with the Tdap or Td immunizations. The Tdap or Td immunization can help protect your newborn  from getting ill.  Rubella immunization.  Varicella (chickenpox) immunization.  Influenza immunization. You should receive this annual immunization if you did not receive the immunization during your pregnancy. Document Released: 07/03/2012 Document Reviewed: 07/03/2012 [facility] Patient Information 2014 [place], [place].  Postpartum Depression and Baby Blues The postpartum period begins right after the birth of a baby. During this time, there is often a great amount of joy and excitement. It is also a time of considerable changes in the life of the parent(s). Regardless of how many times a mother gives birth, each child brings new challenges and dynamics to the family. It is not unusual to have feelings of excitement accompanied by confusing shifts in moods, emotions, and thoughts. All mothers are at risk of developing postpartum depression or the "baby blues." These mood changes can occur right after giving birth, or they may occur many months after giving birth. The baby blues or postpartum depression can be mild or severe. Additionally, postpartum depression can resolve rather quickly, or it can be a long-term condition. CAUSES Elevated hormones and their rapid decline are thought to be a main cause of postpartum depression and the baby blues. There are a number of hormones that radically change during and after pregnancy. Estrogen and progesterone usually decrease immediately after delivering your baby. The level of thyroid hormone and various cortisol steroids also rapidly drop. Other factors that play a major role in these changes include major life events and genetics.  RISK FACTORS If you have any of the following risks for the baby blues or postpartum depression, know what symptoms to watch out for during the postpartum period. Risk factors that may increase the likelihood of getting the baby blues or postpartum depression include:  Havinga personal or family history of  depression.  Having depression while being pregnant.  Having premenstrual or oral contraceptive-associated mood issues.  Having exceptional life stress.  Having marital conflict.  Lacking a social support network.  Having a baby with special needs.  Having health problems such as diabetes. SYMPTOMS Baby blues symptoms include:  Brief fluctuations in mood, such as going from extreme happiness to sadness.  Decreased concentration.  Difficulty sleeping.  Crying spells, tearfulness.  Irritability.  Anxiety. Postpartum depression symptoms typically begin within the first month after giving birth. These symptoms include:  Difficulty sleeping or excessive sleepiness.  Marked weight loss.  Agitation.  Feelings of worthlessness.  Lack of interest in activity or food. Postpartum psychosis is a very concerning condition and can be dangerous. Fortunately, it is rare. Displaying any of the following symptoms is cause for immediate medical attention. Postpartum psychosis symptoms include:  Hallucinations and delusions.  Bizarre or disorganized behavior.  Confusion or disorientation. DIAGNOSIS  A diagnosis is made by an evaluation of your symptoms. There are no medical or lab tests that lead to a diagnosis, but there are various questionnaires that a caregiver may use to identify those with the baby blues, postpartum depression, or psychosis. Often times,  a screening tool called the [place] Postnatal Depression Scale is used to diagnose depression in the postpartum period.  TREATMENT The baby blues usually goes away on its own in 1 to 2 weeks. Social support is often all that is needed. You should be encouraged to get adequate sleep and rest. Occasionally, you may be given medicines to help you sleep.  Postpartum depression requires treatment as it can last several months or longer if it is not treated. Treatment may include individual or group therapy, medicine, or both to  address any social, physiological, and psychological factors that may play a role in the depression. Regular exercise, a healthy diet, rest, and social support may also be strongly recommended.  Postpartum psychosis is more serious and needs treatment right away. Hospitalization is often needed. HOME CARE INSTRUCTIONS  Get as much rest as you can. Nap when the baby sleeps.  Exercise regularly. Some women find yoga and walking to be beneficial.  Eat a balanced and nourishing diet.  Do little things that you enjoy. Have a cup of tea, take a bubble bath, read your favorite magazine, or listen to your favorite music.  Avoid alcohol.  Ask for help with household chores, cooking, grocery shopping, or running errands as needed. Do not try to do everything.  Talk to people close to you about how you are feeling. Get support from your partner, family members, friends, or other new moms.  Try to stay positive in how you think. Think about the things you are grateful for.  Do not spend a lot of time alone.  Only take medicine as directed by your caregiver.  Keep all your postpartum appointments.  Let your caregiver know if you have any concerns. SEEK MEDICAL CARE IF: You are having a reaction or problems with your medicine. SEEK IMMEDIATE MEDICAL CARE IF:  You have suicidal feelings.  You feel you may harm the baby or someone else. Document Released: 07/13/2004 Document Revised: 01/01/2012 Document Reviewed: 07/21/2013 [facility] Patient Information 2014 [place], [place].  Breastfeeding Deciding to breastfeed is one of the best choices you can make for you and your baby. A change in hormones during pregnancy causes your breast tissue to grow and increases the number and size of your milk ducts. These hormones also allow proteins, sugars, and fats from your blood supply to make breast milk in your milk-producing glands. Hormones prevent breast milk from being released before your baby is born  as well as prompt milk flow after birth. Once breastfeeding has begun, thoughts of your baby, as well as his or her sucking or crying, can stimulate the release of milk from your milk-producing glands.  BENEFITS OF BREASTFEEDING For Your Baby  Your first milk (colostrum) helps your baby's digestive system function better.   There are antibodies in your milk that help your baby fight off infections.   Your baby has a lower incidence of asthma, allergies, and sudden infant death syndrome.   The nutrients in breast milk are better for your baby than infant formulas and are designed uniquely for your baby's needs.   Breast milk improves your baby's brain development.   Your baby is less likely to develop other conditions, such as childhood obesity, asthma, or type 2 diabetes mellitus.  For You   Breastfeeding helps to create a very special bond between you and your baby.   Breastfeeding is convenient. Breast milk is always available at the correct temperature and costs nothing.   Breastfeeding helps to burn calories  and helps you lose the weight gained during pregnancy.   Breastfeeding makes your uterus contract to its prepregnancy size faster and slows bleeding (lochia) after you give birth.   Breastfeeding helps to lower your risk of developing type 2 diabetes mellitus, osteoporosis, and breast or ovarian cancer later in life. SIGNS THAT YOUR BABY IS HUNGRY Early Signs of Hunger  Increased alertness or activity.  Stretching.  Movement of the head from side to side.  Movement of the head and opening of the mouth when the corner of the mouth or cheek is stroked (rooting).  Increased sucking sounds, smacking lips, cooing, sighing, or squeaking.  Hand-to-mouth movements.  Increased sucking of fingers or hands. Late Signs of Hunger  Fussing.  Intermittent crying. Extreme Signs of Hunger Signs of extreme hunger will require calming and consoling before your baby  will be able to breastfeed successfully. Do not wait for the following signs of extreme hunger to occur before you initiate breastfeeding:   Restlessness.  A loud, strong cry.   Screaming. BREASTFEEDING BASICS Breastfeeding Initiation  Find a comfortable place to sit or lie down, with your neck and back well supported.  Place a pillow or rolled up blanket under your baby to bring him or her to the level of your breast (if you are seated). Nursing pillows are specially designed to help support your arms and your baby while you breastfeed.  Make sure that your baby's abdomen is facing your abdomen.   Gently massage your breast. With your fingertips, massage from your chest wall toward your nipple in a circular motion. This encourages milk flow. You may need to continue this action during the feeding if your milk flows slowly.  Support your breast with 4 fingers underneath and your thumb above your nipple. Make sure your fingers are well away from your nipple and your baby's mouth.   Stroke your baby's lips gently with your finger or nipple.   When your baby's mouth is open wide enough, quickly bring your baby to your breast, placing your entire nipple and as much of the colored area around your nipple (areola) as possible into your baby's mouth.   More areola should be visible above your baby's upper lip than below the lower lip.   Your baby's tongue should be between his or her lower gum and your breast.   Ensure that your baby's mouth is correctly positioned around your nipple (latched). Your baby's lips should create a seal on your breast and be turned out (everted).  It is common for your baby to suck about 2 3 minutes in order to start the flow of breast milk. Latching Teaching your baby how to latch on to your breast properly is very important. An improper latch can cause nipple pain and decreased milk supply for you and poor weight gain in your baby. Also, if your baby is  not latched onto your nipple properly, he or she may swallow some air during feeding. This can make your baby fussy. Burping your baby when you switch breasts during the feeding can help to get rid of the air. However, teaching your baby to latch on properly is still the best way to prevent fussiness from swallowing air while breastfeeding. Signs that your baby has successfully latched on to your nipple:    Silent tugging or silent sucking, without causing you pain.   Swallowing heard between every 3 4 sucks.    Muscle movement above and in front of his or her  ears while sucking.  Signs that your baby has not successfully latched on to nipple:   Sucking sounds or smacking sounds from your baby while breastfeeding.  Nipple pain. If you think your baby has not latched on correctly, slip your finger into the corner of your baby's mouth to break the suction and place it between your baby's gums. Attempt breastfeeding initiation again. Signs of Successful Breastfeeding Signs from your baby:   A gradual decrease in the number of sucks or complete cessation of sucking.   Falling asleep.   Relaxation of his or her body.   Retention of a small amount of milk in his or her mouth.   Letting go of your breast by himself or herself. Signs from you:  Breasts that have increased in firmness, weight, and size 1 3 hours after feeding.   Breasts that are softer immediately after breastfeeding.  Increased milk volume, as well as a change in milk consistency and color by the 5th day of breastfeeding.   Nipples that are not sore, cracked, or bleeding. Signs That Your [employer] is Getting Enough Milk  Wetting at least 3 diapers in a 24-hour period. The urine should be clear and pale yellow by age 34 days.  At least 3 stools in a 24-hour period by age 34 days. The stool should be soft and yellow.  At least 3 stools in a 24-hour period by age 39 days. The stool should be seedy and yellow.  No  loss of weight greater than 10% of birth weight during the first 11 days of age.  Average weight gain of 4 7 ounces (120 210 mL) per week after age 17 days.  Consistent daily weight gain by age 340 days, without weight loss after the age of 2 weeks. After a feeding, your baby may spit up a small amount. This is common. BREASTFEEDING FREQUENCY AND DURATION Frequent feeding will help you make more milk and can prevent sore nipples and breast engorgement. Breastfeed when you feel the need to reduce the fullness of your breasts or when your baby shows signs of hunger. This is called "breastfeeding on demand." Avoid introducing a pacifier to your baby while you are working to establish breastfeeding (the first 4 6 weeks after your baby is born). After this time you may choose to use a pacifier. Research has shown that pacifier use during the first year of a baby's life decreases the risk of sudden infant death syndrome (SIDS). Allow your baby to feed on each breast as long as he or she wants. Breastfeed until your baby is finished feeding. When your baby unlatches or falls asleep while feeding from the first breast, offer the second breast. Because newborns are often sleepy in the first few weeks of life, you may need to awaken your baby to get him or her to feed. Breastfeeding times will vary from baby to baby. However, the following rules can serve as a guide to help you ensure that your baby is properly fed:  Newborns (babies 71 weeks of age or younger) may breastfeed every 1 3 hours.  Newborns should not go longer than 3 hours during the day or 5 hours during the night without breastfeeding.  You should breastfeed your baby a minimum of 8 times in a 24-hour period until you begin to introduce solid foods to your baby at around 73 months of age. BREAST MILK PUMPING Pumping and storing breast milk allows you to ensure that your baby is exclusively fed  your breast milk, even at times when you are unable to  breastfeed. This is especially important if you are going back to work while you are still breastfeeding or when you are not able to be present during feedings. Your lactation consultant can give you guidelines on how long it is safe to store breast milk.  A breast pump is a machine that allows you to pump milk from your breast into a sterile bottle. The pumped breast milk can then be stored in a refrigerator or freezer. Some breast pumps are operated by hand, while others use electricity. Ask your lactation consultant which type will work best for you. Breast pumps can be purchased, but some hospitals and breastfeeding support groups lease breast pumps on a monthly basis. A lactation consultant can teach you how to hand express breast milk, if you prefer not to use a pump.  CARING FOR YOUR BREASTS WHILE YOU BREASTFEED Nipples can become dry, cracked, and sore while breastfeeding. The following recommendations can help keep your breasts moisturized and healthy:  Avoid using soap on your nipples.   Wear a supportive bra. Although not required, special nursing bras and tank tops are designed to allow access to your breasts for breastfeeding without taking off your entire bra or top. Avoid wearing underwire style bras or extremely tight bras.  Air dry your nipples for 3 14minutes after each feeding.   Use only cotton bra pads to absorb leaked breast milk. Leaking of breast milk between feedings is normal.   Use lanolin on your nipples after breastfeeding. Lanolin helps to maintain your skin's normal moisture barrier. If you use pure lanolin you do not need to wash it off before feeding your baby again. Pure lanolin is not toxic to your baby. You may also hand express a few drops of breast milk and gently massage that milk into your nipples and allow the milk to air dry. In the first few weeks after giving birth, some women experience extremely full breasts (engorgement). Engorgement can make your  breasts feel heavy, warm, and tender to the touch. Engorgement peaks within 3 5 days after you give birth. The following recommendations can help ease engorgement:  Completely empty your breasts while breastfeeding or pumping. You may want to start by applying warm, moist heat (in the shower or with warm water-soaked hand towels) just before feeding or pumping. This increases circulation and helps the milk flow. If your baby does not completely empty your breasts while breastfeeding, pump any extra milk after he or she is finished.  Wear a snug bra (nursing or regular) or tank top for 1 2 days to signal your body to slightly decrease milk production.  Apply ice packs to your breasts, unless this is too uncomfortable for you.  Make sure that your baby is latched on and positioned properly while breastfeeding. If engorgement persists after 48 hours of following these recommendations, contact your health care provider or a Science writer. OVERALL HEALTH CARE RECOMMENDATIONS WHILE BREASTFEEDING  Eat healthy foods. Alternate between meals and snacks, eating 3 of each per day. Because what you eat affects your breast milk, some of the foods may make your baby more irritable than usual. Avoid eating these foods if you are sure that they are negatively affecting your baby.  Drink milk, fruit juice, and water to satisfy your thirst (about 10 glasses a day).   Rest often, relax, and continue to take your prenatal vitamins to prevent fatigue, stress, and anemia.  Continue  breast self-awareness checks.  Avoid chewing and smoking tobacco.  Avoid alcohol and drug use. Some medicines that may be harmful to your baby can pass through breast milk. It is important to ask your health care provider before taking any medicine, including all over-the-counter and prescription medicine as well as vitamin and herbal supplements. It is possible to become pregnant while breastfeeding. If birth control is  desired, ask your health care provider about options that will be safe for your baby. SEEK MEDICAL CARE IF:   You feel like you want to stop breastfeeding or have become frustrated with breastfeeding.  You have painful breasts or nipples.  Your nipples are cracked or bleeding.  Your breasts are red, tender, or warm.  You have a swollen area on either breast.  You have a fever or chills.  You have nausea or vomiting.  You have drainage other than breast milk from your nipples.  Your breasts do not become full before feedings by the 5th day after you give birth.  You feel sad and depressed.  Your baby is too sleepy to eat well.  Your baby is having trouble sleeping.   Your baby is wetting less than 3 diapers in a 24-hour period.  Your baby has less than 3 stools in a 24-hour period.  Your baby's skin or the white part of his or her eyes becomes yellow.   Your baby is not gaining weight by 735 days of age. SEEK IMMEDIATE MEDICAL CARE IF:   Your baby is overly tired (lethargic) and does not want to wake up and feed.  Your baby develops an unexplained fever. Document Released: 10/09/2005 Document Revised: 06/11/2013 Document Reviewed: 04/02/2013 [facility] Patient Information 2014 [place].  Breast Pumping Tips Pumping your breast milk is a good way to stimulate milk production and have a steady supply of breast milk for your infant. Pumping is most helpful during your infant's growth spurts, when involving dad or a family member, or when you are away. There are several types of pumps available. They can be purchased at a baby or maternity store. You can begin pumping soon after delivery, but some experts believe that you should wait about four weeks to give your infant a bottle. In general, the more you breastfeed or pump, the more milk you will have for your infant. It is also important to take good care of yourself. This will reduce stress and help your body to create a  healthy supply of milk. Your caregiver or lactation consultant can give you the information and support you need in your efforts to breastfeed your infant. PUMPING BREAST MILK  Follow the tips below for successful breast pumping. Take care of yourself.  Drink enough water or fluids to keep urine clear or pale yellow. You may notice a thirsty feeling while breastfeeding. This is because your body needs more water to make breast milk. Keep a large water bottle handy. Make healthy drink choices such as unsweetened fruit juice, milk and water. Limit soda, coffee, and alcohol (wait 2 hours to feed or pump if you have an alcoholic drink.)  Eat a healthy, well-balanced diet rich in fruits, vegetables, and whole grains.  Exercise as recommended by your caregiver.  Get plenty of sleep. Sleep when your infant sleeps. Ask friends and family for help if you need time to nap or rest.  Do not smoke. Smoking can lower your milk supply and harm your infant. If you need help quitting, ask your caregiver for  a program recommendation.  Ask your caregiver about birth control options. Birth control pills may lower your milk supply. You may be advised to use condoms or other forms of birth control. Relax and pump Stimulating your let-down reflex is the key to successful and effective pumping. This makes the milk in all parts of the breast flow more freely.   It is easier to pump breast milk (and breastfeed) while you are relaxed. Find techniques that work for you. Quiet private spaces, breast massage, soothing heat placed on the breast, music, and pictures or a tape recording of your infant may help you to relax and "let down" your milk. If you have difficulty with your let down, try smelling one of your infant's blankets or an item of clothing he or she has worn while you are pumping.  When pumping, place the special suction cup (flange) directly over the nipple. It may be uncomfortable and cause nipple damage if it  is not placed properly or is the wrong size. Applying a small amount of purified or modified lanolin to your nipple and the areola may help increase your comfort level. Also, you can change the speed and suction of many electric pumps to your comfort level. Your caregiver or lactation consultant can help you with this.  If pumping continues to be painful, or you feel you are not getting very much milk when you pump, you may need a different type of pump. A lactation consultant can help you determine if this is the case.  If you are with your infant, feed him or her on demand and try pumping after each feeding. This will boost your production, even if milk does not come out. You may not be able to pump much milk at first, but keep up the routine, and this will change.  If you are working or away from your infant for several hours, try pumping for about 15 minutes every 2 to 3 hours. Pump both breasts at the same time if you can.  If your infant has a formula feeding, make sure you pump your milk around the same time to maintain your supply.  Begin pumping breast milk a few weeks before you return to work. This will help you develop techniques that work for you and will be able to store extra milk.  Find a source of breastfeeding information that works well for you. TIPS FOR STORING BREAST MILK  Store breast milk in a sealable sterile bag, jar, or container provided with your pumping supplies.  Store milk in small amounts close to what your infant is drinking at each feeding.  Cool pumped milk in a refrigerator or cooler. Pumped milk can last at the back of the refrigerator for 3 to 8 days.  Place cooled milk at the back of the freezer for up to 3 months.  Thaw the milk in its container or bag in warm water up to 24 hours in advance. Do not use a microwave to thaw or heat milk. Do not refreeze the milk after it has been thawed.  Breast milk is safe to drink when left at room temperature (mid  70s or colder) for 4 to 8 hours. After that, throw it away.  Milk fat can separate and look funny. The color can vary slightly from day to day. This is normal. Always shake the milk before using it to mix the fat with the more watery portion. SEEK MEDICAL CARE IF:   You are having trouble pumping  or feeding your infant.  You are concerned that you are not making enough milk.  You have nipple pain, soreness, or redness.  You have other questions or concerns related to you or your infant. Document Released: 03/29/2010 Document Revised: 01/01/2012 Document Reviewed: 03/29/2010 [facility] Patient Information 2014 [place], [place].  Iron-Rich Diet An iron-rich diet contains foods that are good sources of iron. Iron is an important mineral that helps your body produce hemoglobin. Hemoglobin is a protein in red blood cells that carries oxygen to the body's tissues. Sometimes, the iron level in your blood can be low. This may be caused by:  A lack of iron in your diet.  Blood loss.  Times of growth, such as during pregnancy or during a child's growth and development. Low levels of iron can cause a decrease in the number of red blood cells. This can result in iron deficiency anemia. Iron deficiency anemia symptoms include:  Tiredness.  Weakness.  Irritability.  Increased chance of infection. Here are some recommendations for daily iron intake:  Males older than 33 years of age need 8 mg of iron per day.  Women ages 57 to 70 need 18 mg of iron per day.  Pregnant women need 27 mg of iron per day, and women who are over 56 years of age and breastfeeding need 9 mg of iron per day.  Women over the age of 55 need 8 mg of iron per day. SOURCES OF IRON There are 2 types of iron that are found in food: heme iron and nonheme iron. Heme iron is absorbed by the body better than nonheme iron. Heme iron is found in meat, poultry, and fish. Nonheme iron is found in grains, beans, and vegetables. Heme  Iron Sources Food / Iron (mg)  Chicken liver, 3 oz (85 g)/ 10 mg  Beef liver, 3 oz (85 g)/ 5.5 mg  Oysters, 3 oz (85 g)/ 8 mg  Beef, 3 oz (85 g)/ 2 to 3 mg  Shrimp, 3 oz (85 g)/ 2.8 mg  [place], 3 oz (85 g)/ 2 mg  Chicken, 3 oz (85 g) / 1 mg  Fish (tuna, halibut), 3 oz (85 g)/ 1 mg  Pork, 3 oz (85 g)/ 0.9 mg Nonheme Iron Sources Food / Iron (mg)  Ready-to-eat breakfast cereal, iron-fortified / 3.9 to 7 mg  Tofu,  cup / 3.4 mg  Kidney beans,  cup / 2.6 mg  Baked potato with skin / 2.7 mg  Asparagus,  cup / 2.2 mg  Avocado / 2 mg  Dried peaches,  cup / 1.6 mg  Raisins,  cup / 1.5 mg  Soy milk, 1 cup / 1.5 mg  Whole-wheat bread, 1 slice / 1.2 mg  Spinach, 1 cup / 0.8 mg  Broccoli,  cup / 0.6 mg IRON ABSORPTION Certain foods can decrease the body's absorption of iron. Try to avoid these foods and beverages while eating meals with iron-containing foods:  Coffee.  Tea.  Fiber.  Soy. Foods containing vitamin C can help increase the amount of iron your body absorbs from iron sources, especially from nonheme sources. Eat foods with vitamin C along with iron-containing foods to increase your iron absorption. Foods that are high in vitamin C include many fruits and vegetables. Some good sources are:  Fresh orange juice.  Oranges.  Strawberries.  Mangoes.  Grapefruit.  Red bell peppers.  Green bell peppers.  Broccoli.  Potatoes with skin.  Tomato juice. Document Released: 05/23/2005 Document Revised: 01/01/2012 Document Reviewed:  03/30/2011 [employer] Patient Information 2014 [place], [place].

## 2014-01-24 NOTE — Progress Notes (Signed)
Discharge instructions reviewed with patient.  Patient states understanding of home care.  No home equipment needed.  Patient ambulated for discharge in stable condition with staff without incident. 

## 2014-01-24 NOTE — Discharge Summary (Signed)
Cesarean Section Delivery Discharge Summary  Diana Stanley  DOB:    1981-08-03 MRN:    [id number] CSN:    [id number]  Date of admission:                  01/21/14  Date of discharge:                   01/24/14  Procedures this admission: C/s d/t placental abruption at [redacted]w[redacted]d  Date of Delivery: 01/21/14 by Dr. Su Hilt  Newborn Data:  Live born female  Birth Weight: 4 lb 11.8 oz (2150 g) APGAR: 5, 8  Baby to remain in NICU Circumcision Plan: undecided  History of Present Illness:  Ms. Diana Stanley is a 33 y.o. female, G2P1102, who presents at [redacted]w[redacted]d weeks gestation. The patient has been followed at the [facility] and Gynecology division of [facility].    Her pregnancy has been complicated by:  Patient Active Problem List   Diagnosis Date Noted  . S/P primary low transverse C-section 01/21/2014     Hospital course:  The patient was admitted for vaginal bleeding - placental abruption.   Her postpartum course was complicated by asymptomatic anemia. She was discharged to home on postpartum day 3 doing well.  Feeding:  breast  Contraception:  condoms  Discharge hemoglobin:  Hemoglobin  Date Value Ref Range Status  01/23/2014 8.1* 12.0 - 15.0 g/dL Final     HCT  Date Value Ref Range Status  01/23/2014 24.1* 36.0 - 46.0 % Final    Discharge Physical Exam:   General: alert, cooperative and no distress Lochia: appropriate Uterine Fundus: firm Incision: healing well DVT Evaluation: No evidence of DVT seen on physical exam. Negative Homan's sign.  Intrapartum Procedures: cesarean: low cervical, transverse Postpartum Procedures: none Complications-Operative and Postpartum: none  Discharge Diagnoses: Preterm delivery d/t placental abruption   Discharge Information:  Activity:           pelvic rest Diet:                routine Medications: PNV, Ibuprofen, Iron and Percocet Condition:      stable Instructions:  Care After  Cesarean Delivery  Refer to this sheet in the next few weeks. These instructions provide you with information on caring for yourself after your procedure. Your caregiver may also give you specific instructions. Your treatment has been planned according to current medical practices, but problems sometimes occur. Call your caregiver if you have any problems or questions after you go home. HOME CARE INSTRUCTIONS  Only take over-the-counter or prescription medicines as directed by your caregiver.  Do not drink alcohol, especially if you are breastfeeding or taking medicine to relieve pain.  Do not chew or smoke tobacco.  Continue to use good perineal care. Good perineal care includes:  Wiping your perineum from front to back.  Keeping your perineum clean.  Check your cut (incision) daily for increased redness, drainage, swelling, or separation of skin.  Clean your incision gently with soap and water every day, and then pat it dry. If your caregiver says it is okay, leave the incision uncovered. Use a bandage (dressing) if the incision is draining fluid or appears irritated. If the adhesive strips across the incision do not fall off within 7 days, carefully peel them off.  Hug a pillow when coughing or sneezing until your incision is healed. This helps to relieve pain.  Do not use tampons or douche until your caregiver says  it is okay.  Shower, wash your hair, and take tub baths as directed by your caregiver.  Wear a well-fitting bra that provides breast support.  Limit wearing support panties or control-top hose.  Drink enough fluids to keep your urine clear or pale yellow.  Eat high-fiber foods such as whole grain cereals and breads, brown rice, beans, and fresh fruits and vegetables every day. These foods may help prevent or relieve constipation.  Resume activities such as climbing stairs, driving, lifting, exercising, or traveling as directed by your caregiver.  Talk to your  caregiver about resuming sexual activities. This is dependent upon your risk of infection, your rate of healing, and your comfort and desire to resume sexual activity.  Try to have someone help you with your household activities and your newborn for at least a few days after you leave the hospital.  Rest as much as possible. Try to rest or take a nap when your newborn is sleeping.  Increase your activities gradually.  Keep all of your scheduled postpartum appointments. It is very important to keep your scheduled follow-up appointments. At these appointments, your caregiver will be checking to make sure that you are healing physically and emotionally. SEEK MEDICAL CARE IF:   You are passing large clots from your vagina. Save any clots to show your caregiver.  You have a foul smelling discharge from your vagina.  You have trouble urinating.  You are urinating frequently.  You have pain when you urinate.  You have a change in your bowel movements.  You have increasing redness, pain, or swelling near your incision.  You have pus draining from your incision.  Your incision is separating.  You have painful, hard, or reddened breasts.  You have a severe headache.  You have blurred vision or see spots.  You feel sad or depressed.  You have thoughts of hurting yourself or your newborn.  You have questions about your care, the care of your newborn, or medicines.  You are dizzy or lightheaded.  You have a rash.  You have pain, redness, or swelling at the site of the removed intravenous access (IV) tube.  You have nausea or vomiting.  You stopped breastfeeding and have not had a menstrual period within 12 weeks of stopping.  You are not breastfeeding and have not had a menstrual period within 12 weeks of delivery.  You have a fever. SEEK IMMEDIATE MEDICAL CARE IF:  You have persistent pain.  You have chest pain.  You have shortness of breath.  You faint.  You have  leg pain.  You have stomach pain.  Your vaginal bleeding saturates 2 or more sanitary pads in 1 hour. MAKE SURE YOU:   Understand these instructions.  Will watch your condition.  Will get help right away if you are not doing well or get worse. Document Released: 07/01/2002 Document Revised: 07/03/2012 Document Reviewed: 06/05/2012 [facility] Patient Information 2014 [place], [place].   Postpartum Depression and Baby Blues  The postpartum period begins right after the birth of a baby. During this time, there is often a great amount of joy and excitement. It is also a time of considerable changes in the life of the parent(s). Regardless of how many times a mother gives birth, each child brings new challenges and dynamics to the family. It is not unusual to have feelings of excitement accompanied by confusing shifts in moods, emotions, and thoughts. All mothers are at risk of developing postpartum depression or the "baby blues." These  mood changes can occur right after giving birth, or they may occur many months after giving birth. The baby blues or postpartum depression can be mild or severe. Additionally, postpartum depression can resolve rather quickly, or it can be a long-term condition. CAUSES Elevated hormones and their rapid decline are thought to be a main cause of postpartum depression and the baby blues. There are a number of hormones that radically change during and after pregnancy. Estrogen and progesterone usually decrease immediately after delivering your baby. The level of thyroid hormone and various cortisol steroids also rapidly drop. Other factors that play a major role in these changes include major life events and genetics.  RISK FACTORS If you have any of the following risks for the baby blues or postpartum depression, know what symptoms to watch out for during the postpartum period. Risk factors that may increase the likelihood of getting the baby blues or postpartum depression  include:  Havinga personal or family history of depression.  Having depression while being pregnant.  Having premenstrual or oral contraceptive-associated mood issues.  Having exceptional life stress.  Having marital conflict.  Lacking a social support network.  Having a baby with special needs.  Having health problems such as diabetes. SYMPTOMS Baby blues symptoms include:  Brief fluctuations in mood, such as going from extreme happiness to sadness.  Decreased concentration.  Difficulty sleeping.  Crying spells, tearfulness.  Irritability.  Anxiety. Postpartum depression symptoms typically begin within the first month after giving birth. These symptoms include:  Difficulty sleeping or excessive sleepiness.  Marked weight loss.  Agitation.  Feelings of worthlessness.  Lack of interest in activity or food. Postpartum psychosis is a very concerning condition and can be dangerous. Fortunately, it is rare. Displaying any of the following symptoms is cause for immediate medical attention. Postpartum psychosis symptoms include:  Hallucinations and delusions.  Bizarre or disorganized behavior.  Confusion or disorientation. DIAGNOSIS  A diagnosis is made by an evaluation of your symptoms. There are no medical or lab tests that lead to a diagnosis, but there are various questionnaires that a caregiver may use to identify those with the baby blues, postpartum depression, or psychosis. Often times, a screening tool called the [place] Postnatal Depression Scale is used to diagnose depression in the postpartum period.  TREATMENT The baby blues usually goes away on its own in 1 to 2 weeks. Social support is often all that is needed. You should be encouraged to get adequate sleep and rest. Occasionally, you may be given medicines to help you sleep.  Postpartum depression requires treatment as it can last several months or longer if it is not treated. Treatment may include  individual or group therapy, medicine, or both to address any social, physiological, and psychological factors that may play a role in the depression. Regular exercise, a healthy diet, rest, and social support may also be strongly recommended.  Postpartum psychosis is more serious and needs treatment right away. Hospitalization is often needed. HOME CARE INSTRUCTIONS  Get as much rest as you can. Nap when the baby sleeps.  Exercise regularly. Some women find yoga and walking to be beneficial.  Eat a balanced and nourishing diet.  Do little things that you enjoy. Have a cup of tea, take a bubble bath, read your favorite magazine, or listen to your favorite music.  Avoid alcohol.  Ask for help with household chores, cooking, grocery shopping, or running errands as needed. Do not try to do everything.  Talk to people close to  you about how you are feeling. Get support from your partner, family members, friends, or other new moms.  Try to stay positive in how you think. Think about the things you are grateful for.  Do not spend a lot of time alone.  Only take medicine as directed by your caregiver.  Keep all your postpartum appointments.  Let your caregiver know if you have any concerns. SEEK MEDICAL CARE IF: You are having a reaction or problems with your medicine. SEEK IMMEDIATE MEDICAL CARE IF:  You have suicidal feelings.  You feel you may harm the baby or someone else. Document Released: 07/13/2004 Document Revised: 01/01/2012 Document Reviewed: 08/15/2011 [facility] Patient Information 2014 [place], [place].  Discharge to: home  Follow-up Information   Follow up with [facility]. Schedule an appointment as soon as possible for a visit in 6 weeks. (Call with any questions or concerns.)    Specialty:  Obstetrics and Gynecology   Contact information:   [address] [place] [address] [phone]       Haroldine Laws 01/24/2014

## 2014-08-24 ENCOUNTER — Encounter (HOSPITAL_COMMUNITY): Payer: Self-pay | Admitting: Obstetrics and Gynecology

## 2015-07-29 ENCOUNTER — Encounter: Payer: Self-pay | Admitting: Obstetrics and Gynecology

## 2015-09-02 ENCOUNTER — Encounter: Payer: Self-pay | Admitting: Obstetrics and Gynecology

## 2015-09-08 ENCOUNTER — Encounter: Payer: Self-pay | Admitting: Obstetrics and Gynecology

## 2015-09-08 ENCOUNTER — Ambulatory Visit (INDEPENDENT_AMBULATORY_CARE_PROVIDER_SITE_OTHER): Payer: 59 | Admitting: Obstetrics and Gynecology

## 2015-09-08 VITALS — BP 100/64 | HR 80 | Ht 65.0 in | Wt 130.6 lb

## 2015-09-08 DIAGNOSIS — Z8759 Personal history of other complications of pregnancy, childbirth and the puerperium: Secondary | ICD-10-CM

## 2015-09-08 DIAGNOSIS — Z3169 Encounter for other general counseling and advice on procreation: Secondary | ICD-10-CM

## 2015-09-08 DIAGNOSIS — Z98891 History of uterine scar from previous surgery: Secondary | ICD-10-CM

## 2015-09-08 DIAGNOSIS — Z01419 Encounter for gynecological examination (general) (routine) without abnormal findings: Secondary | ICD-10-CM | POA: Diagnosis not present

## 2015-09-08 NOTE — Progress Notes (Signed)
Subjective:    Diana Stanley is a 34 y.o. G57P1102 married  female who presents to establish care and for an annual exam. The patient has no complaints today. The patient is sexually active. GYN screening history: last pap: approximate date 2014 and was normal. The patient wears seatbelts: yes. The patient participates in regular exercise: no. Has the patient ever been transfused or tattooed?: no. The patient reports that there is not domestic violence in her life.   Menstrual History: Obstetric History   G2   P2   T1   P1   A0   TAB0   SAB0   E0   M0   L2     # Outcome Date GA Lbr Len/2nd Weight Sex Delivery Anes PTL Lv  2 Preterm 01/21/14 [redacted]w[redacted]d  4 lb 11.8 oz (2.15 kg) M CS-LTranv Spinal  Y     Name: Madewell,BOY Vernica     Complications: Abruptio Placenta  1 Term               Menarche age: 32  Patient's last menstrual period was 08/23/2015. Period Duration (Days): 5-7 Period Pattern: Regular Menstrual Flow: Moderate Dysmenorrhea: (!) Mild Dysmenorrhea Symptoms: Cramping   History reviewed. No pertinent past medical history.   Past Surgical History  Procedure Laterality Date  . Cesarean section N/A 01/21/2014    Procedure:    STAT CESAREAN SECTION;  Surgeon: Purcell Nails, MD;  Location: [facility];  Service: Obstetrics;  Laterality: N/A;    Family History  Problem Relation Age of Onset  . Ovarian cancer Maternal Grandmother   . Hypertension Father     Social History   Social History  . Marital Status: Married    Spouse Name: N/A  . Number of Children: N/A  . Years of Education: N/A   Occupational History  . Not on file.   Social History Main Topics  . Smoking status: Never Smoker   . Smokeless tobacco: Never Used  . Alcohol Use: 0.5 oz/week    1 Standard drinks or equivalent per week  . Drug Use: No  . Sexual Activity: Yes    Birth Control/ Protection: None, Condom   Other Topics Concern  . Not on file   Social History Narrative    No current  outpatient prescriptions on file prior to visit.   No current facility-administered medications on file prior to visit.    No Known Allergies   Review of Systems Constitutional: negative for chills, fatigue, fevers and sweats Eyes: negative for irritation, redness and visual disturbance Ears, nose, mouth, throat, and face: negative for hearing loss, nasal congestion, snoring and tinnitus Respiratory: negative for asthma, cough, sputum Cardiovascular: negative for chest pain, dyspnea, exertional chest pressure/discomfort, irregular heart beat, palpitations and syncope Gastrointestinal: negative for abdominal pain, change in bowel habits, nausea and vomiting Genitourinary: negative for abnormal menstrual periods, genital lesions, sexual problems and vaginal discharge, dysuria and urinary incontinence Integument/breast: negative for breast lump, breast tenderness and nipple discharge Hematologic/lymphatic: negative for bleeding and easy bruising Musculoskeletal:negative for back pain and muscle weakness Neurological: negative for dizziness, headaches, vertigo and weakness Endocrine: negative for diabetic symptoms including polydipsia, polyuria and skin dryness Allergic/Immunologic: negative for hay fever and urticaria    Objective:    Blood pressure 100/64, pulse 80, height  (1.651 m), weight 130 lb 9.6 oz (59.24 kg), last menstrual period 08/23/2015, not currently breastfeeding.  General Appearance:    Alert, cooperative, no distress, appears stated age  Head:  Normocephalic, without obvious abnormality, atraumatic  Eyes:    PERRL, conjunctiva/corneas clear, EOM's intact, both eyes  Ears:    Normal external ear canals, both ears  Nose:   Nares normal, septum midline, mucosa normal, no drainage or sinus tenderness  Throat:   Lips, mucosa, and tongue normal; teeth and gums normal  Neck:   Supple, symmetrical, trachea midline, no adenopathy; thyroid: no  enlargement/tenderness/nodules; no carotid bruit or JVD  Back:     Symmetric, no curvature, ROM normal, no CVA tenderness  Lungs:     Clear to auscultation bilaterally, respirations unlabored  Chest Wall:    No tenderness or deformity   Heart:    Regular rate and rhythm, S1 and S2 normal, no murmur, rub or gallop  Breast Exam:    No tenderness, masses, or nipple abnormality  Abdomen:     Soft, non-tender, bowel sounds active all four quadrants, no masses, no organomegaly.    Genitalia:    Pelvic:external genitalia normal, vagina without lesions, discharge, or tenderness, rectovaginal septum  normal. Cervix normal in appearance, no cervical motion tenderness, no adnexal masses or tenderness.  Uterus normal size, shape, consistency, mobile.   Rectal:    Normal external sphincter.  No hemorrhoids appreciated. Internal exam not done.   Extremities:   Extremities normal, atraumatic, no cyanosis or edema  Pulses:   2+ and symmetric all extremities  Skin:   Skin color, texture, turgor normal, no rashes or lesions  Lymph nodes:   Cervical, supraclavicular, and axillary nodes normal  Neurologic:   CNII-XII intact, normal strength, sensation and reflexes throughout    Assessment:    Healthy female exam.    Plan:     Blood tests: Basic metabolic panel, CBC with diff and Glucose. Breast self exam technique reviewed and patient encouraged to perform self-exam monthly. Contraception: condoms.  Patient and husband considering attempting conception again.  Has questions regarding risk of placental abruption in subsequent pregnancy.  Patient without risk factors other than previous h/o abruption) (HTN, substance/tobacco abuse, abdominal trauma, polyhydramnios or IUGR in prior pregnancy, or maternal age at time of prior abruption).  Discussion had that patient is at risk for abruption again in future pregnancy.  Would recommend care with physician (vs lower level provider such as midwife/NP) due to possible  risk of abruption and need for emergency C-section.  Patient notes understanding.  Will also assess for other risk factors (i.e vascular causes).  Ordered ANA. Lupus anticoagulant today.  Advised that patient would be able to VBAC if desired, as operative report notes low transverse Cesarean section.   Also advised that patient would be considered AMA at time of future delivery, and would recommend genetic screening.  Declines flu vaccine. Declines STD testing.  Pap smear up to date, next due in 2017.  RTC in 1 year or as needed.    Hildred LaserAnika Liev Brockbank, MD [facility]

## 2015-09-09 LAB — BASIC METABOLIC PANEL
BUN/Creatinine Ratio: 28 — ABNORMAL HIGH (ref 8–20)
BUN: 20 mg/dL (ref 6–20)
CALCIUM: 9.2 mg/dL (ref 8.7–10.2)
CO2: 24 mmol/L (ref 18–29)
CREATININE: 0.71 mg/dL (ref 0.57–1.00)
Chloride: 103 mmol/L (ref 97–106)
GFR calc non Af Amer: 111 mL/min/{1.73_m2} (ref >59)
GFR, EST AFRICAN AMERICAN: 129 mL/min/{1.73_m2} (ref >59)
Glucose: 92 mg/dL (ref 65–99)
POTASSIUM: 4.4 mmol/L (ref 3.5–5.2)
Sodium: 139 mmol/L (ref 136–144)

## 2015-09-09 LAB — CBC
HEMATOCRIT: 39.4 % (ref 34.0–46.6)
HEMOGLOBIN: 13.7 g/dL (ref 11.1–15.9)
MCH: 29.7 pg (ref 26.6–33.0)
MCHC: 34.8 g/dL (ref 31.5–35.7)
MCV: 85 fL (ref 79–97)
Platelets: 300 10*3/uL (ref 150–379)
RBC: 4.62 x10E6/uL (ref 3.77–5.28)
RDW: 13.5 % (ref 12.3–15.4)
WBC: 6.1 10*3/uL (ref 3.4–10.8)

## 2015-09-09 LAB — TSH: TSH: 1.04 u[IU]/mL (ref 0.450–4.500)

## 2015-09-09 LAB — ANA: Anti Nuclear Antibody(ANA): NEGATIVE

## 2015-09-12 ENCOUNTER — Encounter: Payer: Self-pay | Admitting: Obstetrics and Gynecology

## 2015-10-24 NOTE — L&D Delivery Note (Signed)
Delivery Note VBAC At  1036am a viable and healthy female was delivered via  (Presentation:LOA ;  ).  APGAR: ,9 ;  .   Placenta status: delivered intact with 3 vessel cord and margina insertion:  with the following complications: none  Anesthesia:  local Episiotomy:  none Lacerations:  posterior vaginal 1 cm in length Suture Repair: 3.0 vicryl rapide Est. Blood Loss (mL):  350  Mom to postpartum.  Baby to Couplet care / Skin to Skin.  Melody NIKE Shambley, CNM 07/12/2016, 11:00 AM

## 2015-12-15 ENCOUNTER — Ambulatory Visit (INDEPENDENT_AMBULATORY_CARE_PROVIDER_SITE_OTHER): Payer: 59 | Admitting: Obstetrics and Gynecology

## 2015-12-15 ENCOUNTER — Encounter: Payer: Self-pay | Admitting: Obstetrics and Gynecology

## 2015-12-15 VITALS — BP 116/74 | HR 99 | Ht 65.0 in | Wt 134.3 lb

## 2015-12-15 DIAGNOSIS — Z3201 Encounter for pregnancy test, result positive: Secondary | ICD-10-CM | POA: Diagnosis not present

## 2015-12-15 DIAGNOSIS — N912 Amenorrhea, unspecified: Secondary | ICD-10-CM | POA: Diagnosis not present

## 2015-12-15 LAB — POCT URINE PREGNANCY: Preg Test, Ur: POSITIVE — AB

## 2015-12-15 NOTE — Progress Notes (Signed)
   GYNECOLOGY CLINIC PROGRESS NOTE  Subjective:    Diana Stanley is a 35 y.o. [phone] female who presents for evaluation of amenorrhea. She believes she could be pregnant. Pregnancy is desired. Sexual Activity: single partner, contraception: none. Current symptoms also include: fatigue, nausea and positive home pregnancy test. Last period was normal.  Patient's last menstrual period was 10/29/2015.   The following portions of the patient's history were reviewed and updated as appropriate: allergies, current medications, past family history, past medical history, past social history, past surgical history and problem list.  Review of Systems Pertinent items are noted in HPI.     Objective:    BP 116/74 mmHg  Pulse 99  Ht  (1.651 m)  Wt 134 lb 4.8 oz (60.918 kg)  BMI 22.35 kg/m2  LMP 10/29/2015  Breastfeeding? No General: alert, no distress and no acute distress    Lab Review Urine HCG: positive    Assessment:    Absence of menstruation.     Plan:    Pregnancy Test: Positive: EDC: 08/04/2016. Briefly discussed pre-natal care options. Pregnancy, Childbirth and the Newborn book given. Encouraged well-balanced diet, plenty of rest when needed, pre-natal vitamins daily and walking for exercise. Discussed self-help for nausea, avoiding OTC medications until consulting provider or pharmacist, other than Tylenol as needed, minimal caffeine (1-2 cups daily) and avoiding alcohol. She will schedule her initial OB visit in the next month with her PCP or OB provider. Feel free to call with any questions. Patient to return in 1 week for NOB intake and viability scan.      Hildred Laser, MD [facility]

## 2015-12-22 ENCOUNTER — Other Ambulatory Visit: Payer: Self-pay | Admitting: Obstetrics and Gynecology

## 2015-12-22 DIAGNOSIS — O3680X0 Pregnancy with inconclusive fetal viability, not applicable or unspecified: Secondary | ICD-10-CM

## 2015-12-24 ENCOUNTER — Ambulatory Visit (INDEPENDENT_AMBULATORY_CARE_PROVIDER_SITE_OTHER): Payer: 59

## 2015-12-24 ENCOUNTER — Ambulatory Visit (INDEPENDENT_AMBULATORY_CARE_PROVIDER_SITE_OTHER): Payer: 59 | Admitting: Obstetrics and Gynecology

## 2015-12-24 VITALS — BP 111/72 | HR 72 | Wt 134.0 lb

## 2015-12-24 DIAGNOSIS — Z3492 Encounter for supervision of normal pregnancy, unspecified, second trimester: Secondary | ICD-10-CM | POA: Diagnosis not present

## 2015-12-24 DIAGNOSIS — Z1389 Encounter for screening for other disorder: Secondary | ICD-10-CM

## 2015-12-24 DIAGNOSIS — Z331 Pregnant state, incidental: Secondary | ICD-10-CM

## 2015-12-24 DIAGNOSIS — Z36 Encounter for antenatal screening of mother: Secondary | ICD-10-CM

## 2015-12-24 DIAGNOSIS — O3680X0 Pregnancy with inconclusive fetal viability, not applicable or unspecified: Secondary | ICD-10-CM | POA: Diagnosis not present

## 2015-12-24 DIAGNOSIS — Z3490 Encounter for supervision of normal pregnancy, unspecified, unspecified trimester: Secondary | ICD-10-CM

## 2015-12-24 DIAGNOSIS — Z113 Encounter for screening for infections with a predominantly sexual mode of transmission: Secondary | ICD-10-CM

## 2015-12-24 DIAGNOSIS — Z369 Encounter for antenatal screening, unspecified: Secondary | ICD-10-CM

## 2015-12-24 DIAGNOSIS — Z1379 Encounter for other screening for genetic and chromosomal anomalies: Secondary | ICD-10-CM | POA: Diagnosis not present

## 2015-12-24 NOTE — Progress Notes (Signed)
Diana Stanley presents for NOB nurse interview visit. G-3.  P-1102. Her dating ultrasound today put her 4 wks ahead of what she thought she was. Pregnancy education material explained and given. No cats in the home. NOB labs ordered.  HIV labs and Drug screen were explained optional and she could opt out of tests but did not decline. Drug screen ordered. PNV encouraged. Pt to have panorama genetic screening today.  Pt. To follow up with provider in 1 weeks for NOB physical.  All questions answered.   ZIKA EXPOSURE SCREEN:  The patient has not traveled to a [place] endemic area within the past 6 months, nor has she had unprotected sex with a partner who has travelled to a [place] endemic region within the past 6 months. The patient has been advised to notify us if these factors change any time during this current pregnancy, so adequate testing and monitoring can be initiated.

## 2015-12-24 NOTE — Patient Instructions (Signed)
Pregnancy and Zika Virus Disease Zika virus disease, or Zika, is an illness that can spread to people from mosquitoes that carry the virus. It may also spread from person to person through infected body fluids. Zika first occurred in [place], but recently it has spread to new areas. The virus occurs in tropical climates. The location of Zika continues to change. Most people who become infected with Zika virus do not develop serious illness. However, Zika may cause birth defects in an unborn baby whose mother is infected with the virus. It may also increase the risk of miscarriage. WHAT ARE THE SYMPTOMS OF ZIKA VIRUS DISEASE? In many cases, people who have been infected with Zika virus do not develop any symptoms. If symptoms appear, they usually start about a week after the person is infected. Symptoms are usually mild. They may include:  Fever.  Rash.  Red eyes.  Joint pain. HOW DOES ZIKA VIRUS DISEASE SPREAD? The main way that Zika virus spreads is through the bite of a certain type of mosquito. Unlike most types of mosquitos, which bite only at night, the type of mosquito that carries Zika virus bites both at night and during the day. Zika virus can also spread through sexual contact, through a blood transfusion, and from a mother to her baby before or during birth. Once you have had Zika virus disease, it is unlikely that you will get it again. CAN I PASS ZIKA TO MY BABY DURING PREGNANCY? Yes, Zika can pass from a mother to her baby before or during birth. WHAT PROBLEMS CAN ZIKA CAUSE FOR MY BABY? A woman who is infected with Zika virus while pregnant is at risk of having her baby born with a condition in which the brain or head is smaller than expected (microcephaly). Babies who have microcephaly can have developmental delays, seizures, hearing problems, and vision problems. Having Zika virus disease during pregnancy can also increase the risk of miscarriage. HOW CAN ZIKA VIRUS DISEASE BE  PREVENTED? There is no vaccine to prevent Zika. The best way to prevent the disease is to avoid infected mosquitoes and avoid exposure to body fluids that can spread the virus. Avoid any possible exposure to Zika by taking the following precautions. For women and their sex partners:  Avoid traveling to high-risk areas. The locations where Zika is being reported change often. To identify high-risk areas, check the CDC travel website: www.cdc.gov/zika/geo/index.html  If you or your sex partner must travel to a high-risk area, talk with a health care provider before and after traveling.  Take all precautions to avoid mosquito bites if you live in, or travel to, any of the high-risk areas. Insect repellents are safe to use during pregnancy.  Ask your health care provider when it is safe to have sexual contact. For women:  If you are pregnant or trying to become pregnant, avoid sexual contact with persons who may have been exposed to Zika virus, persons who have possible symptoms of Zika, or persons whose history you are unsure about. If you choose to have sexual contact with someone who may have been exposed to Zika virus, use condoms correctly during the entire duration of sexual activity, every time. Do not share sexual devices, as you may be exposed to body fluids.  Ask your health care provider about when it is safe to attempt pregnancy after a possible exposure to Zika virus. WHAT STEPS SHOULD I TAKE TO AVOID MOSQUITO BITES? Take these steps to avoid mosquito bites when you are   in a high-risk area:  Wear loose clothing that covers your arms and legs.  Limit your outdoor activities.  Do not open windows unless they have window screens.  Sleep under mosquito nets.  Use insect repellent. The best insect repellents have:  DEET, picaridin, oil of lemon eucalyptus (OLE), or IR3535 in them.  Higher amounts of an active ingredient in them.  Remember that insect repellents are safe to use  during pregnancy.  Do not use OLE on children who are younger than 3 years of age. Do not use insect repellent on babies who are younger than 2 months of age.  Cover your child's stroller with mosquito netting. Make sure the netting fits snugly and that any loose netting does not cover your child's mouth or nose. Do not use a blanket as a mosquito-protection cover.  Do not apply insect repellent underneath clothing.  If you are using sunscreen, apply the sunscreen before applying the insect repellent.  Treat clothing with permethrin. Do not apply permethrin directly to your skin. Follow label directions for safe use.  Get rid of standing water, where mosquitoes may reproduce. Standing water is often found in items such as buckets, bowls, animal food dishes, and flowerpots. When you return from traveling to any high-risk area, continue taking actions to protect yourself against mosquito bites for 3 weeks, even if you show no signs of illness. This will prevent spreading Zika virus to uninfected mosquitoes. WHAT SHOULD I KNOW ABOUT THE SEXUAL TRANSMISSION OF ZIKA? People can spread Zika to their sexual partners during vaginal, anal, or oral sex, or by sharing sexual devices. Many people with Zika do not develop symptoms, so a person could spread the disease without knowing that they are infected. The greatest risk is to women who are pregnant or who may become pregnant. Zika virus can live longer in semen than it can live in blood. Couples can prevent sexual transmission of the virus by:  Using condoms correctly during the entire duration of sexual activity, every time. This includes vaginal, anal, and oral sex.  Not sharing sexual devices. Sharing increases your risk of being exposed to body fluid from another person.  Avoiding all sexual activity until your health care provider says it is safe. SHOULD I BE TESTED FOR ZIKA VIRUS? A sample of your blood can be tested for Zika virus. A pregnant  woman should be tested if she may have been exposed to the virus or if she has symptoms of Zika. She may also have additional tests done during her pregnancy, such ultrasound testing. Talk with your health care provider about which tests are recommended.   This information is not intended to replace advice given to you by your health care provider. Make sure you discuss any questions you have with your health care provider.   Document Released: 06/30/2015 Document Reviewed: 06/23/2015 Elsevier Interactive Patient Education 2016 [employer] Minor Illnesses and Medications in Pregnancy  Cold/Flu:  Sudafed for congestion- Robitussin (plain) for cough- Tylenol for discomfort.  Please follow the directions on the label.  Try not to take any more than needed.  OTC Saline nasal spray and air humidifier or cool-mist  Vaporizer to sooth nasal irritation and to loosen congestion.  It is also important to increase intake of non carbonated fluids, especially if you have a fever.  Constipation:  Colace-2 capsules at bedtime; Metamucil- follow directions on label; Senokot- 1 tablet at bedtime.  Any one of these medications can be used.  It is also   very important to increase fluids and fruits along with regular exercise.  If problem persists please call the office.  Diarrhea:  Kaopectate as directed on the label.  Eat a bland diet and increase fluids.  Avoid highly seasoned foods.  Headache:  Tylenol 1 or 2 tablets every 3-4 hours as needed  Indigestion:  Maalox, Mylanta, Tums or Rolaids- as directed on label.  Also try to eat small meals and avoid fatty, greasy or spicy foods.  Nausea with or without Vomiting:  Nausea in pregnancy is caused by increased levels of hormones in the body which influence the digestive system and cause irritation when stomach acids accumulate.  Symptoms usually subside after 1st trimester of pregnancy.  Try the following:  Keep saltines, graham crackers or dry toast by your bed  to eat upon awakening.  Don't let your stomach get empty.  Try to eat 5-6 small meals per day instead of 3 large ones.  Avoid greasy fatty or highly seasoned foods.   Take OTC Unisom 1 tablet at bed time along with OTC Vitamin B6 25-50 mg 3 times per day.    If nausea continues with vomiting and you are unable to keep down food and fluids you may need a prescription medication.  Please notify your provider.   Sore throat:  Chloraseptic spray, throat lozenges and or plain Tylenol.  Vaginal Yeast Infection:  OTC Monistat for 7 days as directed on label.  If symptoms do not resolve within a week notify provider.  If any of the above problems do not subside with recommended treatment please call the office for further assistance.   Do not take Aspirin, Advil, Motrin or Ibuprofen.  * * OTC= Over the counter Hyperemesis Gravidarum Hyperemesis gravidarum is a severe form of nausea and vomiting that happens during pregnancy. Hyperemesis is worse than morning sickness. It may cause you to have nausea or vomiting all day for many days. It may keep you from eating and drinking enough food and liquids. Hyperemesis usually occurs during the first half (the first 20 weeks) of pregnancy. It often goes away once a woman is in her second half of pregnancy. However, sometimes hyperemesis continues through an entire pregnancy.  CAUSES  The cause of this condition is not completely known but is thought to be related to changes in the body's hormones when pregnant. It could be from the high level of the pregnancy hormone or an increase in estrogen in the body.  SIGNS AND SYMPTOMS   Severe nausea and vomiting.  Nausea that does not go away.  Vomiting that does not allow you to keep any food down.  Weight loss and body fluid loss (dehydration).  Having no desire to eat or not liking food you have previously enjoyed. DIAGNOSIS  Your health care provider will do a physical exam and ask you about your symptoms.  He or she may also order blood tests and urine tests to make sure something else is not causing the problem.  TREATMENT  You may only need medicine to control the problem. If medicines do not control the nausea and vomiting, you will be treated in the hospital to prevent dehydration, increased acid in the blood (acidosis), weight loss, and changes in the electrolytes in your body that may harm the unborn baby (fetus). You may need IV fluids.  HOME CARE INSTRUCTIONS   Only take over-the-counter or prescription medicines as directed by your health care provider.  Try eating a couple of dry crackers or   toast in the morning before getting out of bed.  Avoid foods and smells that upset your stomach.  Avoid fatty and spicy foods.  Eat 5-6 small meals a day.  Do not drink when eating meals. Drink between meals.  For snacks, eat high-protein foods, such as cheese.  Eat or suck on things that have ginger in them. Ginger helps nausea.  Avoid food preparation. The smell of food can spoil your appetite.  Avoid iron pills and iron in your multivitamins until after 3-4 months of being pregnant. However, consult with your health care provider before stopping any prescribed iron pills. SEEK MEDICAL CARE IF:   Your abdominal pain increases.  You have a severe headache.  You have vision problems.  You are losing weight. SEEK IMMEDIATE MEDICAL CARE IF:   You are unable to keep fluids down.  You vomit blood.  You have constant nausea and vomiting.  You have excessive weakness.  You have extreme thirst.  You have dizziness or fainting.  You have a fever or persistent symptoms for more than 2-3 days.  You have a fever and your symptoms suddenly get worse. MAKE SURE YOU:   Understand these instructions.  Will watch your condition.  Will get help right away if you are not doing well or get worse.   This information is not intended to replace advice given to you by your health care  provider. Make sure you discuss any questions you have with your health care provider.   Document Released: 10/09/2005 Document Revised: 07/30/2013 Document Reviewed: 05/21/2013 Elsevier Interactive Patient Education 2016 Elsevier Inc. Commonly Asked Questions During Pregnancy  Cats: A parasite can be excreted in cat feces.  To avoid exposure you need to have another person empty the little box.  If you must empty the litter box you will need to wear gloves.  Wash your hands after handling your cat.  This parasite can also be found in raw or undercooked meat so this should also be avoided.  Colds, Sore Throats, Flu: Please check your medication sheet to see what you can take for symptoms.  If your symptoms are unrelieved by these medications please call the office.  Dental Work: Most any dental work your dentist recommends is permitted.  X-rays should only be taken during the first trimester if absolutely necessary.  Your abdomen should be shielded with a lead apron during all x-rays.  Please notify your provider prior to receiving any x-rays.  Novocaine is fine; gas is not recommended.  If your dentist requires a note from us prior to dental work please call the office and we will provide one for you.  Exercise: Exercise is an important part of staying healthy during your pregnancy.  You may continue most exercises you were accustomed to prior to pregnancy.  Later in your pregnancy you will most likely notice you have difficulty with activities requiring balance like riding a bicycle.  It is important that you listen to your body and avoid activities that put you at a higher risk of falling.  Adequate rest and staying well hydrated are a must!  If you have questions about the safety of specific activities ask your provider.    Exposure to Children with illness: Try to avoid obvious exposure; report any symptoms to us when noted,  If you have chicken pos, red measles or mumps, you should be immune to  these diseases.   Please do not take any vaccines while pregnant unless you have checked with   your OB provider.  Fetal Movement: After 28 weeks we recommend you do "kick counts" twice daily.  Lie or sit down in a calm quiet environment and count your baby movements "kicks".  You should feel your baby at least 10 times per hour.  If you have not felt 10 kicks within the first hour get up, walk around and have something sweet to eat or drink then repeat for an additional hour.  If count remains less than 10 per hour notify your provider.  Fumigating: Follow your pest control agent's advice as to how long to stay out of your home.  Ventilate the area well before re-entering.  Hemorrhoids:   Most over-the-counter preparations can be used during pregnancy.  Check your medication to see what is safe to use.  It is important to use a stool softener or fiber in your diet and to drink lots of liquids.  If hemorrhoids seem to be getting worse please call the office.   Hot Tubs:  Hot tubs Jacuzzis and saunas are not recommended while pregnant.  These increase your internal body temperature and should be avoided.  Intercourse:  Sexual intercourse is safe during pregnancy as long as you are comfortable, unless otherwise advised by your provider.  Spotting may occur after intercourse; report any bright red bleeding that is heavier than spotting.  Labor:  If you know that you are in labor, please go to the hospital.  If you are unsure, please call the office and let us help you decide what to do.  Lifting, straining, etc:  If your job requires heavy lifting or straining please check with your provider for any limitations.  Generally, you should not lift items heavier than that you can lift simply with your hands and arms (no back muscles)  Painting:  Paint fumes do not harm your pregnancy, but may make you ill and should be avoided if possible.  Latex or water based paints have less odor than oils.  Use adequate  ventilation while painting.  Permanents & Hair Color:  Chemicals in hair dyes are not recommended as they cause increase hair dryness which can increase hair loss during pregnancy.  " Highlighting" and permanents are allowed.  Dye may be absorbed differently and permanents may not hold as well during pregnancy.  Sunbathing:  Use a sunscreen, as skin burns easily during pregnancy.  Drink plenty of fluids; avoid over heating.  Tanning Beds:  Because their possible side effects are still unknown, tanning beds are not recommended.  Ultrasound Scans:  Routine ultrasounds are performed at approximately 20 weeks.  You will be able to see your baby's general anatomy an if you would like to know the gender this can usually be determined as well.  If it is questionable when you conceived you may also receive an ultrasound early in your pregnancy for dating purposes.  Otherwise ultrasound exams are not routinely performed unless there is a medical necessity.  Although you can request a scan we ask that you pay for it when conducted because insurance does not cover " patient request" scans.  Work: If your pregnancy proceeds without complications you may work until your due date, unless your physician or employer advises otherwise.  Round Ligament Pain/Pelvic Discomfort:  Sharp, shooting pains not associated with bleeding are fairly common, usually occurring in the second trimester of pregnancy.  They tend to be worse when standing up or when you remain standing for long periods of time.  These are the result   of pressure of certain pelvic ligaments called "round ligaments".  Rest, Tylenol and heat seem to be the most effective relief.  As the womb and fetus grow, they rise out of the pelvis and the discomfort improves.  Please notify the office if your pain seems different than that described.  It may represent a more serious condition.   

## 2015-12-25 LAB — URINALYSIS, ROUTINE W REFLEX MICROSCOPIC
Bilirubin, UA: NEGATIVE
GLUCOSE, UA: NEGATIVE
Ketones, UA: NEGATIVE
LEUKOCYTES UA: NEGATIVE
NITRITE UA: NEGATIVE
Protein, UA: NEGATIVE
RBC, UA: NEGATIVE
SPEC GRAV UA: 1.021 (ref 1.005–1.030)
Urobilinogen, Ur: 0.2 mg/dL (ref 0.2–1.0)
pH, UA: 7 (ref 5.0–7.5)

## 2015-12-25 LAB — PAIN MGT SCRN (14 DRUGS), UR
Amphetamine Screen, Ur: NEGATIVE ng/mL
BARBITURATE SCRN UR: NEGATIVE ng/mL
BUPRENORPHINE, URINE: NEGATIVE ng/mL
Benzodiazepine Screen, Urine: NEGATIVE ng/mL
COCAINE(METAB.) SCREEN, URINE: NEGATIVE ng/mL
CREATININE(CRT), U: 133.3 mg/dL (ref 20.0–300.0)
Cannabinoids Ur Ql Scn: NEGATIVE ng/mL
Fentanyl, Urine: NEGATIVE pg/mL
Meperidine Screen, Urine: NEGATIVE ng/mL
Methadone Scn, Ur: NEGATIVE ng/mL
Opiate Scrn, Ur: NEGATIVE ng/mL
Oxycodone+Oxymorphone Ur Ql Scn: NEGATIVE ng/mL
PCP SCRN UR: NEGATIVE ng/mL
PH UR, DRUG SCRN: 7.2 (ref 4.5–8.9)
PROPOXYPHENE SCREEN: NEGATIVE ng/mL
TRAMADOL UR QL SCN: NEGATIVE ng/mL

## 2015-12-25 LAB — NICOTINE SCREEN, URINE: Cotinine Ql Scrn, Ur: NEGATIVE ng/mL

## 2015-12-26 LAB — URINE CULTURE, OB REFLEX: Organism ID, Bacteria: NO GROWTH

## 2015-12-26 LAB — CULTURE, OB URINE

## 2015-12-27 LAB — CBC WITH DIFFERENTIAL/PLATELET
BASOS ABS: 0 10*3/uL (ref 0.0–0.2)
Basos: 0 %
EOS (ABSOLUTE): 0.2 10*3/uL (ref 0.0–0.4)
Eos: 2 %
HEMATOCRIT: 40.9 % (ref 34.0–46.6)
Hemoglobin: 13.9 g/dL (ref 11.1–15.9)
IMMATURE GRANULOCYTES: 0 %
Immature Grans (Abs): 0 10*3/uL (ref 0.0–0.1)
LYMPHS ABS: 1.8 10*3/uL (ref 0.7–3.1)
Lymphs: 19 %
MCH: 29.3 pg (ref 26.6–33.0)
MCHC: 34 g/dL (ref 31.5–35.7)
MCV: 86 fL (ref 79–97)
MONOCYTES: 7 %
MONOS ABS: 0.7 10*3/uL (ref 0.1–0.9)
Neutrophils Absolute: 6.7 10*3/uL (ref 1.4–7.0)
Neutrophils: 72 %
Platelets: 348 10*3/uL (ref 150–379)
RBC: 4.74 x10E6/uL (ref 3.77–5.28)
RDW: 13.3 % (ref 12.3–15.4)
WBC: 9.4 10*3/uL (ref 3.4–10.8)

## 2015-12-27 LAB — RH TYPE: RH TYPE: POSITIVE

## 2015-12-27 LAB — HIV ANTIBODY (ROUTINE TESTING W REFLEX): HIV Screen 4th Generation wRfx: NONREACTIVE

## 2015-12-27 LAB — ANTIBODY SCREEN: ANTIBODY SCREEN: NEGATIVE

## 2015-12-27 LAB — GC/CHLAMYDIA PROBE AMP
Chlamydia trachomatis, NAA: NEGATIVE
Neisseria gonorrhoeae by PCR: NEGATIVE

## 2015-12-27 LAB — VARICELLA ZOSTER ANTIBODY, IGM: Varicella IgM: <0.91 index (ref 0.00–0.90)

## 2015-12-27 LAB — ABO

## 2015-12-27 LAB — HEPATITIS B SURFACE ANTIGEN: HEP B S AG: NEGATIVE

## 2015-12-27 LAB — RUBELLA ANTIBODY, IGM

## 2015-12-27 LAB — RPR: RPR: NONREACTIVE

## 2016-01-03 ENCOUNTER — Telehealth: Payer: Self-pay

## 2016-01-03 ENCOUNTER — Encounter: Payer: Self-pay | Admitting: Obstetrics and Gynecology

## 2016-01-03 NOTE — Telephone Encounter (Signed)
-----   Message from Hildred LaserAnika Cherry, MD sent at 01/03/2016  3:03 PM EDT ----- Please inform patient that her Panaroma screen is normal.  Is female gender if desires to know.

## 2016-01-03 NOTE — Telephone Encounter (Signed)
Called pt informed her of information below.  

## 2016-01-11 ENCOUNTER — Ambulatory Visit (INDEPENDENT_AMBULATORY_CARE_PROVIDER_SITE_OTHER): Payer: 59 | Admitting: Obstetrics and Gynecology

## 2016-01-11 ENCOUNTER — Encounter: Payer: Self-pay | Admitting: Obstetrics and Gynecology

## 2016-01-11 VITALS — BP 110/70 | HR 88

## 2016-01-11 DIAGNOSIS — Z3490 Encounter for supervision of normal pregnancy, unspecified, unspecified trimester: Secondary | ICD-10-CM

## 2016-01-11 DIAGNOSIS — O09212 Supervision of pregnancy with history of pre-term labor, second trimester: Secondary | ICD-10-CM

## 2016-01-11 DIAGNOSIS — O09892 Supervision of other high risk pregnancies, second trimester: Secondary | ICD-10-CM | POA: Insufficient documentation

## 2016-01-11 DIAGNOSIS — Z331 Pregnant state, incidental: Secondary | ICD-10-CM

## 2016-01-11 DIAGNOSIS — Z8759 Personal history of other complications of pregnancy, childbirth and the puerperium: Secondary | ICD-10-CM | POA: Insufficient documentation

## 2016-01-11 DIAGNOSIS — Z98891 History of uterine scar from previous surgery: Secondary | ICD-10-CM | POA: Insufficient documentation

## 2016-01-11 DIAGNOSIS — Z3492 Encounter for supervision of normal pregnancy, unspecified, second trimester: Secondary | ICD-10-CM

## 2016-01-11 LAB — POCT URINALYSIS DIPSTICK
Bilirubin, UA: NEGATIVE
Blood, UA: NEGATIVE
Glucose, UA: NEGATIVE
Nitrite, UA: NEGATIVE
Protein, UA: NEGATIVE
SPEC GRAV UA: 1.02
UROBILINOGEN UA: 0.2
pH, UA: 6

## 2016-01-11 NOTE — Progress Notes (Signed)
Nob pe today.

## 2016-01-11 NOTE — Progress Notes (Signed)
GYN ENCOUNTER NOTE  Subjective:       Diana HansenRuth R Stanley is a 35 y.o. [phone] female is here for gynecologic evaluation of the following issues:  1. Amenorrhea secondary to pregnancy.    35 y/o [id number] Female presents for her New OB visit. LMP unsure. Fetal age 35.6 weeks by EDD (US) 07/09/16. Pt does not have any chronic medical conditions. Pregnancy has the following risk factors: AMA at delivery, previous premature delivery. First pregnancy had a vaginal delivery. Second pregnancy had a spontaneous placental abruption at 33 weeks and 5 days resulting in a STAT C-Section. Patient is taking prenatal vitamins. Endorses mild nausea not currently seeking treatment. Denies fatigue, vomiting, breast tenderness, bleeding, Or cramping. Denies tobacco, alcohol, or drug use. Pt had Maternit 21 genetic testing performed with normal result.    Gynecologic History Patient's last menstrual period was 10/29/2015. Last Pap: 08/2015. Results were: Normal   Obstetric History OB History  Gravida Para Term Preterm AB SAB TAB Ectopic Multiple Living  3 2 1 1  0 0 0 0 0 2    # Outcome Date GA Lbr Len/2nd Weight Sex Delivery Anes PTL Lv  3 Current           2 Preterm 01/21/14 [id number]  4 lb 11.8 oz (2.15 kg) M CS-LTranv Spinal  Y     Complications: Abruptio Placenta  1 Term 2012   7 lb 3.2 oz (3.266 kg) M Vag-Spont   Y      No past medical history on file.  Past Surgical History  Procedure Laterality Date  . Cesarean section N/A 01/21/2014    Procedure:    STAT CESAREAN SECTION;  Surgeon: Purcell NailsAngela Y Roberts, MD;  Location: [facility];  Service: Obstetrics;  Laterality: N/A;    Current Outpatient Prescriptions on File Prior to Visit  Medication Sig Dispense Refill  . Prenatal Vit-Fe Fumarate-FA (PRENATAL MULTIVITAMIN) TABS tablet Take 1 tablet by mouth daily at 12 noon.     No current facility-administered medications on file prior to visit.    No Known Allergies  Social History   Social History  . Marital  Status: Married    Spouse Name: N/A  . Number of Children: N/A  . Years of Education: N/A   Occupational History  . Not on file.   Social History Main Topics  . Smoking status: Never Smoker   . Smokeless tobacco: Never Used  . Alcohol Use: 0.5 oz/week    1 Standard drinks or equivalent per week  . Drug Use: No  . Sexual Activity: Yes    Birth Control/ Protection: None     Comment: Pregnant    Other Topics Concern  . Not on file   Social History Narrative    Family History  Problem Relation Age of Onset  . Ovarian cancer Maternal Grandmother   . Hypertension Father     The following portions of the patient's history were reviewed and updated as appropriate: allergies, current medications, past family history, past medical history, past social history, past surgical history and problem list.  Review of Systems Review of Systems - Breast ROS: negative for - tenderness Review of Systems - General ROS: negative for - chills, fatigue, fever, hot flashes, malaise or night sweats Hematological and Lymphatic ROS: negative for - bleeding problems or swollen lymph nodes Gastrointestinal ROS: negative for - abdominal pain, blood in stools, change in bowel habits and nausea/vomiting Musculoskeletal ROS: negative for - joint pain, muscle pain or muscular weakness Genito-Urinary  ROS: negative for - change in menstrual cycle, dysmenorrhea, dyspareunia, dysuria, genital discharge, genital ulcers, hematuria, incontinence, irregular/heavy menses, nocturia or pelvic pain  Objective:   BP 110/70 mmHg  Pulse 88  LMP 10/29/2015 CONSTITUTIONAL: Well-developed, well-nourished female in no acute distress.  HENT:  Normocephalic, atraumatic.  NECK: Normal range of motion, supple, no masses.  Normal thyroid.  SKIN: Skin is warm and dry. No rash noted. Not diaphoretic. No erythema. No pallor. NEUROLGIC: Alert and oriented to person, place, and time. PSYCHIATRIC: Normal mood and affect. Normal  behavior. Normal judgment and thought content. CARDIOVASCULAR: RRR no m/r/g RESPIRATORY: CTAB BREASTS: Not Examined ABDOMEN: Soft, non distended; Non tender.  No Organomegaly. PELVIC:  External Genitalia: Normal  BUS: Normal  Vagina: Normal  Cervix: Normal  Uterus: Sized to 13-14 weeks, Normal shape,consistency, mobile  Adnexa: Normal  RV: Normal   Bladder: Nontender MUSCULOSKELETAL: Normal range of motion. No tenderness.  No cyanosis, clubbing, or edema.  FHR 162 bpm    Assessment:   1. Supervision of normal pregnancy, second trimester - POCT urinalysis dipstick 2. Prior Preterm Delivery due to C/S for Abruption, unclear etiology, at 33 weeks. 3. Prior C/S; TOLAC candidate 4. AMA; Maternity 21 screen - normal     Plan:  1. Discussed option of amniocentesis for AMA  2. Discussed 170HP injection option for preterm birth risk reduction 3. Discussed VBAC risks/benefits. 4. New OB counseling: The patient has been given an overview regarding routine prenatal care. Recommendations regarding diet, weight gain, and exercise in pregnancy were given. Prenatal testing, optional genetic testing, and ultrasound use in pregnancy were reviewed.  Benefits of Breast Feeding were discussed. The patient is encouraged to consider nursing her baby post partum.   Diana Arenas, PA-S Herold Harms, MD    I have seen, interviewed, and examined the patient in conjunction with the [facility].A. student and affirm the diagnosis and management plan. Martin A. DeFrancesco, MD, FACOG   Note: This dictation was prepared with Dragon dictation along with smaller phrase technology. Any transcriptional errors that result from this process are unintentional.

## 2016-02-01 ENCOUNTER — Ambulatory Visit (INDEPENDENT_AMBULATORY_CARE_PROVIDER_SITE_OTHER): Payer: 59 | Admitting: Obstetrics and Gynecology

## 2016-02-01 VITALS — BP 110/66 | HR 79 | Wt 134.0 lb

## 2016-02-01 DIAGNOSIS — O0992 Supervision of high risk pregnancy, unspecified, second trimester: Secondary | ICD-10-CM

## 2016-02-01 DIAGNOSIS — Z8759 Personal history of other complications of pregnancy, childbirth and the puerperium: Secondary | ICD-10-CM

## 2016-02-01 DIAGNOSIS — O09892 Supervision of other high risk pregnancies, second trimester: Secondary | ICD-10-CM

## 2016-02-01 DIAGNOSIS — O09212 Supervision of pregnancy with history of pre-term labor, second trimester: Secondary | ICD-10-CM

## 2016-02-01 LAB — POCT URINALYSIS DIPSTICK
Bilirubin, UA: NEGATIVE
Blood, UA: NEGATIVE
GLUCOSE UA: NEGATIVE
Ketones, UA: NEGATIVE
Leukocytes, UA: NEGATIVE
NITRITE UA: NEGATIVE
Protein, UA: NEGATIVE
SPEC GRAV UA: 1.015
Urobilinogen, UA: NEGATIVE
pH, UA: 6.5

## 2016-02-01 MED ORDER — ASPIRIN EC 81 MG PO TBEC: 81.0000 mg | DELAYED_RELEASE_TABLET | Freq: Every day | ORAL | Status: DC

## 2016-02-01 NOTE — Progress Notes (Signed)
ROB: Patient doing well, no complaints.  Inquires if she needs to be on 17-OHP. Discussed that her preterm delivery was due to placental issues, and therefore does not qualify for requiring progesterone injections.  For anatomy scan in 2 weeks, RTC in 4 weeks.

## 2016-02-16 ENCOUNTER — Ambulatory Visit (INDEPENDENT_AMBULATORY_CARE_PROVIDER_SITE_OTHER): Payer: 59

## 2016-02-16 DIAGNOSIS — O09212 Supervision of pregnancy with history of pre-term labor, second trimester: Secondary | ICD-10-CM | POA: Diagnosis not present

## 2016-02-16 DIAGNOSIS — O09892 Supervision of other high risk pregnancies, second trimester: Secondary | ICD-10-CM

## 2016-03-01 ENCOUNTER — Ambulatory Visit (INDEPENDENT_AMBULATORY_CARE_PROVIDER_SITE_OTHER): Payer: 59 | Admitting: Obstetrics and Gynecology

## 2016-03-01 ENCOUNTER — Encounter: Payer: Self-pay | Admitting: Obstetrics and Gynecology

## 2016-03-01 VITALS — BP 120/81 | HR 92 | Wt 135.4 lb

## 2016-03-01 DIAGNOSIS — O09899 Supervision of other high risk pregnancies, unspecified trimester: Secondary | ICD-10-CM | POA: Insufficient documentation

## 2016-03-01 DIAGNOSIS — Z331 Pregnant state, incidental: Secondary | ICD-10-CM

## 2016-03-01 DIAGNOSIS — Z789 Other specified health status: Secondary | ICD-10-CM

## 2016-03-01 DIAGNOSIS — Z283 Underimmunization status: Secondary | ICD-10-CM

## 2016-03-01 DIAGNOSIS — O9989 Other specified diseases and conditions complicating pregnancy, childbirth and the puerperium: Secondary | ICD-10-CM

## 2016-03-01 DIAGNOSIS — Z2839 Other underimmunization status: Secondary | ICD-10-CM

## 2016-03-01 LAB — POCT URINALYSIS DIPSTICK
Bilirubin, UA: NEGATIVE
Blood, UA: NEGATIVE
Glucose, UA: NEGATIVE
Ketones, UA: NEGATIVE
LEUKOCYTES UA: NEGATIVE
NITRITE UA: NEGATIVE
PH UA: 6
PROTEIN UA: NEGATIVE
Spec Grav, UA: 1.01
UROBILINOGEN UA: 0.2

## 2016-03-01 NOTE — Progress Notes (Signed)
ROB- doing well, reviewed ultrasound.

## 2016-03-01 NOTE — Progress Notes (Signed)
ROB-pt denies any complaints 

## 2016-03-30 ENCOUNTER — Encounter: Payer: Self-pay | Admitting: Obstetrics and Gynecology

## 2016-03-30 ENCOUNTER — Ambulatory Visit (INDEPENDENT_AMBULATORY_CARE_PROVIDER_SITE_OTHER): Payer: 59 | Admitting: Obstetrics and Gynecology

## 2016-03-30 VITALS — BP 112/70 | HR 79 | Wt 140.2 lb

## 2016-03-30 DIAGNOSIS — Z369 Encounter for antenatal screening, unspecified: Secondary | ICD-10-CM

## 2016-03-30 DIAGNOSIS — Z1389 Encounter for screening for other disorder: Secondary | ICD-10-CM

## 2016-03-30 DIAGNOSIS — Z36 Encounter for antenatal screening of mother: Secondary | ICD-10-CM

## 2016-03-30 LAB — POCT URINALYSIS DIPSTICK
Bilirubin, UA: NEGATIVE
Blood, UA: NEGATIVE
Glucose, UA: NEGATIVE
Ketones, UA: NEGATIVE
Leukocytes, UA: NEGATIVE
Nitrite, UA: NEGATIVE
Protein, UA: NEGATIVE
Spec Grav, UA: 1.01
Urobilinogen, UA: NEGATIVE
pH, UA: 6

## 2016-04-17 ENCOUNTER — Other Ambulatory Visit: Payer: Self-pay | Admitting: *Deleted

## 2016-04-17 DIAGNOSIS — Z131 Encounter for screening for diabetes mellitus: Secondary | ICD-10-CM

## 2016-04-17 DIAGNOSIS — Z3493 Encounter for supervision of normal pregnancy, unspecified, third trimester: Secondary | ICD-10-CM

## 2016-04-18 ENCOUNTER — Ambulatory Visit (INDEPENDENT_AMBULATORY_CARE_PROVIDER_SITE_OTHER): Payer: 59 | Admitting: Obstetrics and Gynecology

## 2016-04-18 ENCOUNTER — Encounter: Payer: Self-pay | Admitting: Obstetrics and Gynecology

## 2016-04-18 VITALS — BP 115/74 | HR 81 | Wt 142.4 lb

## 2016-04-18 DIAGNOSIS — Z3493 Encounter for supervision of normal pregnancy, unspecified, third trimester: Secondary | ICD-10-CM

## 2016-04-18 DIAGNOSIS — Z23 Encounter for immunization: Secondary | ICD-10-CM | POA: Diagnosis not present

## 2016-04-18 DIAGNOSIS — Z131 Encounter for screening for diabetes mellitus: Secondary | ICD-10-CM | POA: Diagnosis not present

## 2016-04-18 LAB — POCT URINALYSIS DIPSTICK
BILIRUBIN UA: NEGATIVE
Glucose, UA: NEGATIVE
KETONES UA: NEGATIVE
Leukocytes, UA: NEGATIVE
Nitrite, UA: NEGATIVE
PH UA: 7
Protein, UA: NEGATIVE
RBC UA: NEGATIVE
Spec Grav, UA: 1.015
Urobilinogen, UA: 0.2

## 2016-04-18 MED ORDER — TETANUS-DIPHTH-ACELL PERTUSSIS 5-2.5-18.5 LF-MCG/0.5 IM SUSP
0.5000 mL | Freq: Once | INTRAMUSCULAR | Status: AC
  Administered 2016-04-18: 0.5 mL via INTRAMUSCULAR

## 2016-04-18 NOTE — Patient Instructions (Signed)

## 2016-04-18 NOTE — Progress Notes (Signed)
ROB- doing well, glucola done. Desires natural childbirth.

## 2016-04-18 NOTE — Progress Notes (Signed)
ROB- glucola done, blood consent signed,tdap given Pt is doing well 

## 2016-04-19 ENCOUNTER — Other Ambulatory Visit: Payer: Self-pay | Admitting: Obstetrics and Gynecology

## 2016-04-19 DIAGNOSIS — R7309 Other abnormal glucose: Secondary | ICD-10-CM

## 2016-04-19 LAB — GLUCOSE, 1 HOUR GESTATIONAL: GESTATIONAL DIABETES SCREEN: 146 mg/dL — AB (ref 65–139)

## 2016-04-19 LAB — HEMOGLOBIN AND HEMATOCRIT, BLOOD
HEMATOCRIT: 33.4 % — AB (ref 34.0–46.6)
HEMOGLOBIN: 11.4 g/dL (ref 11.1–15.9)

## 2016-04-26 ENCOUNTER — Telehealth: Payer: Self-pay | Admitting: *Deleted

## 2016-04-26 NOTE — Telephone Encounter (Signed)
-----   Message from [place]ody N Sham[place], [place] sent at 04/19/2016  2:53 PM EDT ----- Please let her know she failed her glucola,but no signs of anemia, needs to schedule 3H GTT within next week

## 2016-04-26 NOTE — Telephone Encounter (Signed)
Notified pt she is coming in 04/27/16

## 2016-04-27 ENCOUNTER — Other Ambulatory Visit: Payer: 59

## 2016-04-27 ENCOUNTER — Other Ambulatory Visit: Payer: Self-pay | Admitting: Obstetrics and Gynecology

## 2016-04-27 DIAGNOSIS — R7309 Other abnormal glucose: Secondary | ICD-10-CM

## 2016-04-27 DIAGNOSIS — R7302 Impaired glucose tolerance (oral): Secondary | ICD-10-CM | POA: Diagnosis not present

## 2016-04-28 ENCOUNTER — Telehealth: Payer: Self-pay | Admitting: *Deleted

## 2016-04-28 LAB — GESTATIONAL GLUCOSE TOLERANCE
GLUCOSE 1 HOUR GTT: 139 mg/dL (ref 65–179)
GLUCOSE 2 HOUR GTT: 130 mg/dL (ref 65–154)
GLUCOSE 3 HOUR GTT: 105 mg/dL (ref 65–139)
GLUCOSE FASTING: 82 mg/dL (ref 65–94)

## 2016-04-28 NOTE — Telephone Encounter (Signed)
-----   Message from Purcell NailsMelody N Shambley, [place] sent at 04/28/2016  1:39 PM EDT ----- Please let her know she passed her three hour test. No worries

## 2016-04-28 NOTE — Telephone Encounter (Signed)
-----   Message from Melody N Shambley, CNM sent at 04/28/2016  1:39 PM EDT ----- Please let her know she passed her three hour test. No worries 

## 2016-04-28 NOTE — Telephone Encounter (Signed)
Notified pt of results 

## 2016-04-28 NOTE — Telephone Encounter (Signed)
Notified pt. 

## 2016-05-03 ENCOUNTER — Ambulatory Visit (INDEPENDENT_AMBULATORY_CARE_PROVIDER_SITE_OTHER): Payer: 59 | Admitting: Obstetrics and Gynecology

## 2016-05-03 ENCOUNTER — Encounter: Payer: Self-pay | Admitting: Obstetrics and Gynecology

## 2016-05-03 VITALS — BP 118/75 | HR 85 | Wt 147.7 lb

## 2016-05-03 DIAGNOSIS — Z3492 Encounter for supervision of normal pregnancy, unspecified, second trimester: Secondary | ICD-10-CM

## 2016-05-03 DIAGNOSIS — Z98891 History of uterine scar from previous surgery: Secondary | ICD-10-CM

## 2016-05-03 DIAGNOSIS — Z8759 Personal history of other complications of pregnancy, childbirth and the puerperium: Secondary | ICD-10-CM

## 2016-05-03 LAB — POCT URINALYSIS DIPSTICK
GLUCOSE UA: NEGATIVE
Ketones, UA: NEGATIVE
NITRITE UA: NEGATIVE
Protein, UA: NEGATIVE
RBC UA: NEGATIVE
Spec Grav, UA: 1.01
UROBILINOGEN UA: 0.2
pH, UA: 8.5

## 2016-05-03 NOTE — Progress Notes (Signed)
Occasional Braxton Hicks contractions

## 2016-05-23 ENCOUNTER — Ambulatory Visit (INDEPENDENT_AMBULATORY_CARE_PROVIDER_SITE_OTHER): Payer: 59 | Admitting: Obstetrics and Gynecology

## 2016-05-23 VITALS — BP 125/75 | HR 76 | Wt 150.4 lb

## 2016-05-23 DIAGNOSIS — Z3493 Encounter for supervision of normal pregnancy, unspecified, third trimester: Secondary | ICD-10-CM

## 2016-05-23 DIAGNOSIS — Z0289 Encounter for other administrative examinations: Secondary | ICD-10-CM

## 2016-05-23 LAB — POCT URINALYSIS DIPSTICK
BILIRUBIN UA: NEGATIVE
GLUCOSE UA: NEGATIVE
KETONES UA: NEGATIVE
NITRITE UA: NEGATIVE
PH UA: 6
Protein, UA: NEGATIVE
RBC UA: NEGATIVE
Urobilinogen, UA: 0.2

## 2016-05-23 NOTE — Progress Notes (Signed)
ROB- pt denies any new complaints 

## 2016-05-23 NOTE — Progress Notes (Signed)
ROB- doing well, discussed labor plans,

## 2016-06-07 ENCOUNTER — Ambulatory Visit (INDEPENDENT_AMBULATORY_CARE_PROVIDER_SITE_OTHER): Payer: 59 | Admitting: Obstetrics and Gynecology

## 2016-06-07 VITALS — BP 129/72 | HR 82 | Wt 152.5 lb

## 2016-06-07 DIAGNOSIS — Z113 Encounter for screening for infections with a predominantly sexual mode of transmission: Secondary | ICD-10-CM

## 2016-06-07 DIAGNOSIS — Z3685 Encounter for antenatal screening for Streptococcus B: Secondary | ICD-10-CM

## 2016-06-07 DIAGNOSIS — Z3493 Encounter for supervision of normal pregnancy, unspecified, third trimester: Secondary | ICD-10-CM

## 2016-06-07 DIAGNOSIS — Z36 Encounter for antenatal screening of mother: Secondary | ICD-10-CM | POA: Diagnosis not present

## 2016-06-07 LAB — POCT URINALYSIS DIPSTICK
Blood, UA: NEGATIVE
Glucose, UA: 100
Ketones, UA: 40
NITRITE UA: NEGATIVE
PH UA: 6
Spec Grav, UA: 1.015
UROBILINOGEN UA: 0.2

## 2016-06-07 NOTE — Progress Notes (Signed)
ROB- doing well, cultures obtained, labor precautions discussed.

## 2016-06-07 NOTE — Progress Notes (Signed)
ROB- cultures obtained, pt is having some pelvic pressure 

## 2016-06-09 LAB — GC/CHLAMYDIA PROBE AMP
Chlamydia trachomatis, NAA: NEGATIVE
NEISSERIA GONORRHOEAE BY PCR: NEGATIVE

## 2016-06-09 LAB — URINE CULTURE: Organism ID, Bacteria: NO GROWTH

## 2016-06-09 LAB — STREP GP B NAA: Strep Gp B NAA: POSITIVE — AB

## 2016-06-12 ENCOUNTER — Encounter: Payer: 59 | Admitting: Obstetrics and Gynecology

## 2016-06-12 ENCOUNTER — Other Ambulatory Visit: Payer: Self-pay | Admitting: Obstetrics and Gynecology

## 2016-06-12 DIAGNOSIS — O99820 Streptococcus B carrier state complicating pregnancy: Secondary | ICD-10-CM | POA: Insufficient documentation

## 2016-06-13 ENCOUNTER — Ambulatory Visit (INDEPENDENT_AMBULATORY_CARE_PROVIDER_SITE_OTHER): Payer: 59 | Admitting: Obstetrics and Gynecology

## 2016-06-13 VITALS — BP 134/70 | HR 71 | Wt 154.0 lb

## 2016-06-13 DIAGNOSIS — Z3493 Encounter for supervision of normal pregnancy, unspecified, third trimester: Secondary | ICD-10-CM

## 2016-06-13 LAB — POCT URINALYSIS DIPSTICK
BILIRUBIN UA: NEGATIVE
Blood, UA: NEGATIVE
GLUCOSE UA: 500
KETONES UA: NEGATIVE
LEUKOCYTES UA: NEGATIVE
Nitrite, UA: NEGATIVE
PROTEIN UA: NEGATIVE
Urobilinogen, UA: 0.2
pH, UA: 7

## 2016-06-13 NOTE — Progress Notes (Signed)
ROB- discussed GBS+ results, doing well,

## 2016-06-13 NOTE — Progress Notes (Signed)
ROB- pt is having increased d/c, some pelvic pressure

## 2016-06-13 NOTE — Patient Instructions (Signed)
Group B streptococcus (GBS) is a type of bacteria often found in healthy women. GBS is not the same as the bacteria that causes strep throat. You may have GBS in your vagina, rectum, or bladder. GBS does not spread through sexual contact, but it can be passed to a baby during childbirth. This can be dangerous for your baby. It is not dangerous to you and usually does not cause any symptoms. Your health care provider may test you for GBS when your pregnancy is between 35 and 37 weeks. GBS is dangerous only during birth, so there is no need to test for it earlier. It is possible to have GBS during pregnancy and never pass it to your baby. If your test results are positive for GBS, your health care provider may recommend giving you antibiotic medicine during delivery to make sure your baby stays healthy. RISK FACTORS You are more likely to pass GBS to your baby if:   Your water breaks (ruptured membrane) or you go into labor before 37 weeks.  Your water breaks 18 hours before you deliver.  You passed GBS during a previous pregnancy.  You have a urinary tract infection caused by GBS any time during pregnancy.  You have a fever during labor. SYMPTOMS Most women who have GBS do not have any symptoms. If you have a urinary tract infection caused by GBS, you might have frequent or painful urination and fever. Babies who get GBS usually show symptoms within 7 days of birth. Symptoms may include:   Breathing problems.  Heart and blood pressure problems.  Digestive and kidney problems. DIAGNOSIS Routine screening for GBS is recommended for all pregnant women. A health care provider takes a sample of the fluid in your vagina and rectum with a swab. It is then sent to a lab to be checked for GBS. A sample of your urine may also be checked for the bacteria.  TREATMENT If you test positive for GBS, you may need treatment with an antibiotic medicine during labor. As soon as you go into labor, or as soon as  your membranes rupture, you will get the antibiotic medicine through an IV access. You will continue to get the medicine until after you give birth. You do not need antibiotic medicine if you are having a cesarean delivery.If your baby shows signs or symptoms of GBS after birth, your baby can also be treated with an antibiotic medicine. HOME CARE INSTRUCTIONS   Take all antibiotic medicine as prescribed by your health care provider. Only take medicine as directed.   Continue with prenatal visits and care.   Keep all follow-up appointments.  SEEK MEDICAL CARE IF:   You have pain when you urinate.   You have to urinate frequently.   You have a fever.  SEEK IMMEDIATE MEDICAL CARE IF:   Your membranes rupture.  You go into labor.   This information is not intended to replace advice given to you by your health care provider. Make sure you discuss any questions you have with your health care provider.   Document Released: 01/16/2008 Document Revised: 10/14/2013 Document Reviewed: 08/01/2013 Elsevier Interactive Patient Education 2016 Elsevier Inc.  

## 2016-06-21 ENCOUNTER — Ambulatory Visit (INDEPENDENT_AMBULATORY_CARE_PROVIDER_SITE_OTHER): Payer: 59 | Admitting: Obstetrics and Gynecology

## 2016-06-21 VITALS — BP 145/78 | HR 76 | Wt 155.2 lb

## 2016-06-21 DIAGNOSIS — Z3493 Encounter for supervision of normal pregnancy, unspecified, third trimester: Secondary | ICD-10-CM

## 2016-06-21 LAB — POCT URINALYSIS DIPSTICK
BILIRUBIN UA: NEGATIVE
Blood, UA: NEGATIVE
GLUCOSE UA: 100
KETONES UA: 15
LEUKOCYTES UA: NEGATIVE
Nitrite, UA: NEGATIVE
Protein, UA: NEGATIVE
Spec Grav, UA: 1.01
Urobilinogen, UA: 0.2
pH, UA: 6

## 2016-06-21 NOTE — Progress Notes (Signed)
ROB-doing well, no concerns. 

## 2016-06-21 NOTE — Progress Notes (Signed)
ROB- pt c/o pelvic pressure, lots of braxton-hicks

## 2016-06-27 ENCOUNTER — Ambulatory Visit (INDEPENDENT_AMBULATORY_CARE_PROVIDER_SITE_OTHER): Payer: 59 | Admitting: Obstetrics and Gynecology

## 2016-06-27 VITALS — BP 134/82 | HR 72 | Wt 153.2 lb

## 2016-06-27 DIAGNOSIS — Z3493 Encounter for supervision of normal pregnancy, unspecified, third trimester: Secondary | ICD-10-CM | POA: Diagnosis not present

## 2016-06-27 LAB — POCT URINALYSIS DIPSTICK
Bilirubin, UA: NEGATIVE
Glucose, UA: NEGATIVE
Ketones, UA: NEGATIVE
Nitrite, UA: NEGATIVE
PH UA: 6.5
PROTEIN UA: NEGATIVE
RBC UA: NEGATIVE
SPEC GRAV UA: 1.01
Urobilinogen, UA: 0.2

## 2016-06-27 NOTE — Progress Notes (Signed)
ROB- doing well, discussed postdates care.     

## 2016-06-27 NOTE — Progress Notes (Signed)
ROB- pt is having pelvic pressure, braxton hicks

## 2016-06-29 LAB — URINE CULTURE

## 2016-07-04 ENCOUNTER — Other Ambulatory Visit: Payer: Self-pay | Admitting: Obstetrics and Gynecology

## 2016-07-07 ENCOUNTER — Ambulatory Visit (INDEPENDENT_AMBULATORY_CARE_PROVIDER_SITE_OTHER): Payer: 59 | Admitting: Obstetrics and Gynecology

## 2016-07-07 VITALS — BP 140/83 | HR 76 | Wt 156.0 lb

## 2016-07-07 DIAGNOSIS — Z3493 Encounter for supervision of normal pregnancy, unspecified, third trimester: Secondary | ICD-10-CM

## 2016-07-07 LAB — POCT URINALYSIS DIPSTICK
BILIRUBIN UA: NEGATIVE
Glucose, UA: NEGATIVE
Ketones, UA: 5
LEUKOCYTES UA: NEGATIVE
Nitrite, UA: NEGATIVE
PH UA: 6
Protein, UA: NEGATIVE
RBC UA: NEGATIVE
Spec Grav, UA: 1.015
Urobilinogen, UA: 0.2

## 2016-07-07 NOTE — Progress Notes (Signed)
ROB- pt is having slight pelvic pressure, otherwise doing ok, will get flu vaccine @ work

## 2016-07-07 NOTE — Progress Notes (Signed)
ROB- doing well, postdates care discussed

## 2016-07-11 ENCOUNTER — Other Ambulatory Visit: Payer: 59

## 2016-07-11 ENCOUNTER — Ambulatory Visit (INDEPENDENT_AMBULATORY_CARE_PROVIDER_SITE_OTHER): Payer: 59

## 2016-07-11 ENCOUNTER — Ambulatory Visit (INDEPENDENT_AMBULATORY_CARE_PROVIDER_SITE_OTHER): Payer: 59 | Admitting: Obstetrics and Gynecology

## 2016-07-11 VITALS — BP 134/83 | HR 70 | Wt 156.3 lb

## 2016-07-11 DIAGNOSIS — Z3493 Encounter for supervision of normal pregnancy, unspecified, third trimester: Secondary | ICD-10-CM

## 2016-07-11 DIAGNOSIS — Z369 Encounter for antenatal screening, unspecified: Secondary | ICD-10-CM

## 2016-07-11 DIAGNOSIS — Z1389 Encounter for screening for other disorder: Secondary | ICD-10-CM

## 2016-07-11 DIAGNOSIS — Z36 Encounter for antenatal screening of mother: Secondary | ICD-10-CM | POA: Diagnosis not present

## 2016-07-11 LAB — POCT URINALYSIS DIPSTICK
BILIRUBIN UA: NEGATIVE
Blood, UA: NEGATIVE
Ketones, UA: NEGATIVE
LEUKOCYTES UA: NEGATIVE
NITRITE UA: NEGATIVE
PH UA: 6
Protein, UA: NEGATIVE
Spec Grav, UA: 1.015
Urobilinogen, UA: NEGATIVE

## 2016-07-11 NOTE — Progress Notes (Signed)
NONSTRESS TEST INTERPRETATION  INDICATIONS: POST DATES  FHR baseline: 140 RESULTS: reactive COMMENTS: NST B/P's:  B/P-127/82, P-84; B/P-137/86, P-80   PLAN: 1. Continue fetal kick counts twice a day. 2. Continue antepartum testing as scheduled-Biweekly 3. RTO as scheduled  Fenton Mallingebbie Frankie Zito, LPN

## 2016-07-11 NOTE — Progress Notes (Signed)
ROB, NST and growth scan_ Indications:Post Dates Growth and AFI  Findings:  Singleton intrauterine pregnancy is visualized with FHR at 171 BPM. Biometrics give an (U/S) Gestational age of 35 1/7 weeks and an (U/S) EDD of 07/24/16; this does not correlate  with the clinically established EDD of 07/09/16  .  Fetal presentation is Vertex.  EFW: 3628g (8lb) Williams 58th percentile. Placenta: Anterior, grade 1, remote to cervix. AFI:  Lower limit of normal at 8.6cm.    Impression: 1. 38 1/7 week Viable Singleton Intrauterine pregnancy by U/S. 2. (U/S) EDD is NOT consistent with Clinically established (LMP) EDD of  07/09/16. (15 day difference)

## 2016-07-12 ENCOUNTER — Inpatient Hospital Stay
Admission: EM | Admit: 2016-07-12 | Discharge: 2016-07-14 | DRG: 775 | Disposition: A | Discharge status: Home / Self Care | Payer: 59 | Attending: Obstetrics and Gynecology | Admitting: Obstetrics and Gynecology

## 2016-07-12 DIAGNOSIS — Z8249 Family history of ischemic heart disease and other diseases of the circulatory system: Secondary | ICD-10-CM | POA: Diagnosis not present

## 2016-07-12 DIAGNOSIS — Z3493 Encounter for supervision of normal pregnancy, unspecified, third trimester: Secondary | ICD-10-CM | POA: Diagnosis not present

## 2016-07-12 DIAGNOSIS — Z7982 Long term (current) use of aspirin: Secondary | ICD-10-CM | POA: Diagnosis not present

## 2016-07-12 DIAGNOSIS — Z8041 Family history of malignant neoplasm of ovary: Secondary | ICD-10-CM | POA: Diagnosis not present

## 2016-07-12 DIAGNOSIS — Z3A40 40 weeks gestation of pregnancy: Secondary | ICD-10-CM

## 2016-07-12 DIAGNOSIS — O99820 Streptococcus B carrier state complicating pregnancy: Secondary | ICD-10-CM

## 2016-07-12 DIAGNOSIS — O34219 Maternal care for unspecified type scar from previous cesarean delivery: Principal | ICD-10-CM | POA: Diagnosis present

## 2016-07-12 DIAGNOSIS — Z283 Underimmunization status: Secondary | ICD-10-CM

## 2016-07-12 DIAGNOSIS — O09899 Supervision of other high risk pregnancies, unspecified trimester: Secondary | ICD-10-CM

## 2016-07-12 DIAGNOSIS — Z79899 Other long term (current) drug therapy: Secondary | ICD-10-CM

## 2016-07-12 DIAGNOSIS — O9081 Anemia of the puerperium: Secondary | ICD-10-CM | POA: Diagnosis present

## 2016-07-12 DIAGNOSIS — O9989 Other specified diseases and conditions complicating pregnancy, childbirth and the puerperium: Secondary | ICD-10-CM

## 2016-07-12 DIAGNOSIS — Z2839 Other underimmunization status: Secondary | ICD-10-CM

## 2016-07-12 LAB — CBC
HEMATOCRIT: 33.6 % — AB (ref 35.0–47.0)
HEMOGLOBIN: 11.3 g/dL — AB (ref 12.0–16.0)
MCH: 25.7 pg — ABNORMAL LOW (ref 26.0–34.0)
MCHC: 33.6 g/dL (ref 32.0–36.0)
MCV: 76.5 fL — AB (ref 80.0–100.0)
Platelets: 286 10*3/uL (ref 150–440)
RBC: 4.38 MIL/uL (ref 3.80–5.20)
RDW: 14.8 % — AB (ref 11.5–14.5)
WBC: 16.4 10*3/uL — AB (ref 3.6–11.0)

## 2016-07-12 LAB — TYPE AND SCREEN
ABO/RH(D): O POS
Antibody Screen: NEGATIVE

## 2016-07-12 MED ORDER — ONDANSETRON HCL 4 MG/2ML IJ SOLN: 4.0000 mg | INTRAMUSCULAR | Status: DC | PRN

## 2016-07-12 MED ORDER — LACTATED RINGERS IV SOLN: INTRAVENOUS | Status: DC

## 2016-07-12 MED ORDER — VARICELLA VIRUS VACCINE LIVE 1350 PFU/0.5ML IJ SUSR
0.5000 mL | Freq: Once | INTRAMUSCULAR | Status: AC
  Administered 2016-07-14: 0.5 mL via SUBCUTANEOUS
  Filled 2016-07-12: qty 0.5

## 2016-07-12 MED ORDER — CEFAZOLIN IN D5W 1 GM/50ML IV SOLN: 1.0000 g | Freq: Three times a day (TID) | INTRAVENOUS | Status: DC

## 2016-07-12 MED ORDER — OXYTOCIN 40 UNITS IN LACTATED RINGERS INFUSION - SIMPLE MED
2.5000 [IU]/h | INTRAVENOUS | Status: DC
  Administered 2016-07-12: 2.5 [IU]/h via INTRAVENOUS

## 2016-07-12 MED ORDER — OXYCODONE-ACETAMINOPHEN 5-325 MG PO TABS: 1.0000 | ORAL_TABLET | ORAL | Status: DC | PRN

## 2016-07-12 MED ORDER — LACTATED RINGERS IV SOLN: 500.0000 mL | INTRAVENOUS | Status: DC | PRN

## 2016-07-12 MED ORDER — SODIUM CHLORIDE 0.9 % IV SOLN: 250.0000 mL | INTRAVENOUS | Status: DC | PRN

## 2016-07-12 MED ORDER — SODIUM CHLORIDE 0.9 % IV SOLN
1.0000 g | INTRAVENOUS | Status: DC
  Filled 2016-07-12 (×6): qty 1000

## 2016-07-12 MED ORDER — FENTANYL CITRATE (PF) 100 MCG/2ML IJ SOLN: 50.0000 ug | INTRAMUSCULAR | Status: DC | PRN

## 2016-07-12 MED ORDER — PRENATAL MULTIVITAMIN CH
1.0000 | ORAL_TABLET | Freq: Every day | ORAL | Status: DC
  Administered 2016-07-13 – 2016-07-14 (×2): 1 via ORAL
  Filled 2016-07-12 (×2): qty 1

## 2016-07-12 MED ORDER — DIPHENHYDRAMINE HCL 25 MG PO CAPS: 25.0000 mg | ORAL_CAPSULE | Freq: Four times a day (QID) | ORAL | Status: DC | PRN

## 2016-07-12 MED ORDER — OXYCODONE-ACETAMINOPHEN 5-325 MG PO TABS: 2.0000 | ORAL_TABLET | ORAL | Status: DC | PRN

## 2016-07-12 MED ORDER — OXYTOCIN BOLUS FROM INFUSION: 500.0000 mL | Freq: Once | INTRAVENOUS | Status: DC

## 2016-07-12 MED ORDER — SOD CITRATE-CITRIC ACID 500-334 MG/5ML PO SOLN: 30.0000 mL | ORAL | Status: DC | PRN

## 2016-07-12 MED ORDER — SIMETHICONE 80 MG PO CHEW: 80.0000 mg | CHEWABLE_TABLET | ORAL | Status: DC | PRN

## 2016-07-12 MED ORDER — ONDANSETRON HCL 4 MG/2ML IJ SOLN
4.0000 mg | Freq: Four times a day (QID) | INTRAMUSCULAR | Status: DC | PRN
  Administered 2016-07-12: 4 mg via INTRAVENOUS
  Filled 2016-07-12: qty 2

## 2016-07-12 MED ORDER — SODIUM CHLORIDE 0.9% FLUSH: 3.0000 mL | INTRAVENOUS | Status: DC | PRN

## 2016-07-12 MED ORDER — BENZOCAINE-MENTHOL 20-0.5 % EX AERO
1.0000 "application " | INHALATION_SPRAY | CUTANEOUS | Status: DC | PRN
  Administered 2016-07-13: 1 via TOPICAL
  Filled 2016-07-12: qty 56

## 2016-07-12 MED ORDER — ONDANSETRON HCL 4 MG/2ML IJ SOLN: 4.0000 mg | Freq: Four times a day (QID) | INTRAMUSCULAR | Status: DC | PRN

## 2016-07-12 MED ORDER — SODIUM CHLORIDE 0.9% FLUSH: 3.0000 mL | Freq: Two times a day (BID) | INTRAVENOUS | Status: DC

## 2016-07-12 MED ORDER — ONDANSETRON HCL 4 MG PO TABS: 4.0000 mg | ORAL_TABLET | ORAL | Status: DC | PRN

## 2016-07-12 MED ORDER — LIDOCAINE HCL (PF) 1 % IJ SOLN: 30.0000 mL | INTRAMUSCULAR | Status: DC | PRN

## 2016-07-12 MED ORDER — ZOLPIDEM TARTRATE 5 MG PO TABS: 5.0000 mg | ORAL_TABLET | Freq: Every evening | ORAL | Status: DC | PRN

## 2016-07-12 MED ORDER — OXYTOCIN BOLUS FROM INFUSION
500.0000 mL | Freq: Once | INTRAVENOUS | Status: AC
  Administered 2016-07-12: 500 mL via INTRAVENOUS

## 2016-07-12 MED ORDER — ACETAMINOPHEN 325 MG PO TABS: 650.0000 mg | ORAL_TABLET | ORAL | Status: DC | PRN

## 2016-07-12 MED ORDER — WITCH HAZEL-GLYCERIN EX PADS: 1.0000 "application " | MEDICATED_PAD | CUTANEOUS | Status: DC | PRN

## 2016-07-12 MED ORDER — DOCUSATE SODIUM 100 MG PO CAPS
100.0000 mg | ORAL_CAPSULE | Freq: Two times a day (BID) | ORAL | Status: DC
  Administered 2016-07-12 – 2016-07-14 (×4): 100 mg via ORAL
  Filled 2016-07-12 (×4): qty 1

## 2016-07-12 MED ORDER — DIBUCAINE 1 % RE OINT: 1.0000 "application " | TOPICAL_OINTMENT | RECTAL | Status: DC | PRN

## 2016-07-12 MED ORDER — SOD CITRATE-CITRIC ACID 500-334 MG/5ML PO SOLN
30.0000 mL | ORAL | Status: DC | PRN
  Filled 2016-07-12: qty 30

## 2016-07-12 MED ORDER — COCONUT OIL OIL: 1.0000 "application " | TOPICAL_OIL | Status: DC | PRN

## 2016-07-12 MED ORDER — SENNOSIDES-DOCUSATE SODIUM 8.6-50 MG PO TABS
2.0000 | ORAL_TABLET | ORAL | Status: DC
  Administered 2016-07-12 – 2016-07-13 (×2): 2 via ORAL
  Filled 2016-07-12 (×2): qty 2

## 2016-07-12 MED ORDER — SODIUM CHLORIDE 0.9 % IV SOLN
2.0000 g | Freq: Once | INTRAVENOUS | Status: AC
  Administered 2016-07-12: 2 g via INTRAVENOUS
  Filled 2016-07-12: qty 2000

## 2016-07-12 MED ORDER — CEFAZOLIN SODIUM-DEXTROSE 2-4 GM/100ML-% IV SOLN: 2.0000 g | Freq: Once | INTRAVENOUS | Status: DC

## 2016-07-12 MED ORDER — MEASLES, MUMPS & RUBELLA VAC ~~LOC~~ INJ
0.5000 mL | INJECTION | Freq: Once | SUBCUTANEOUS | Status: AC
  Administered 2016-07-14: 0.5 mL via SUBCUTANEOUS
  Filled 2016-07-12 (×2): qty 0.5

## 2016-07-12 MED ORDER — IBUPROFEN 600 MG PO TABS
600.0000 mg | ORAL_TABLET | Freq: Four times a day (QID) | ORAL | Status: DC
  Administered 2016-07-12 – 2016-07-13 (×3): 600 mg via ORAL
  Filled 2016-07-12 (×3): qty 1

## 2016-07-12 MED ORDER — OXYTOCIN 40 UNITS IN LACTATED RINGERS INFUSION - SIMPLE MED
2.5000 [IU]/h | INTRAVENOUS | Status: DC
  Filled 2016-07-12: qty 1000

## 2016-07-12 NOTE — H&P (Signed)
Obstetric History and Physical  Diana Stanley is a 35 y.o. [phone] with IUP at [redacted]w[redacted]d presenting with irregular contractions and LOF. Patient states she has been having  irregular, every 3-5 minutes contractions, none vaginal bleeding, ruptured, clear fluid membranes, with active fetal movement.    Prenatal Course Source of Care: [facility]  Pregnancy complications or risks:previous c/s at 33 weeks for placental abruption  Prenatal labs and studies: ABO, Rh: O/Positive/-- (03/03 1548) Antibody: Negative (03/03 1548) Rubella: <20.0 (03/03 1548) RPR: Non Reactive (03/03 1548)  HBsAg: Negative (03/03 1548)  HIV: Non Reactive (03/03 1548)  AVW:UJWJXBJY (08/16 1627) 1 hr Glucola  normal Genetic screening normal Anatomy US normal  History reviewed. No pertinent past medical history.  Past Surgical History:  Procedure Laterality Date  . CESAREAN SECTION N/A 01/21/2014   Procedure:    STAT CESAREAN SECTION;  Surgeon: Purcell Nails, MD;  Location: [facility];  Service: Obstetrics;  Laterality: N/A;    OB History  Gravida Para Term Preterm AB Living  3 2 1 1  0 2  SAB TAB Ectopic Multiple Live Births  0 0 0 0 2    # Outcome Date GA Lbr Len/2nd Weight Sex Delivery Anes PTL Lv  3 Current           2 Preterm 01/21/14 [redacted]w[redacted]d  4 lb 11.8 oz (2.15 kg) M CS-LTranv Spinal  LIV     Complications: Abruptio Placenta  1 Term 2012   7 lb 3.2 oz (3.266 kg) M Vag-Spont   LIV    Obstetric Comments  No etiology to Abruption identified    Social History   Social History  . Marital status: Married    Spouse name: N/A  . Number of children: N/A  . Years of education: N/A   Social History Main Topics  . Smoking status: Never Smoker  . Smokeless tobacco: Never Used  . Alcohol use 0.5 oz/week    1 Standard drinks or equivalent per week  . Drug use: No  . Sexual activity: Yes    Birth control/ protection: None     Comment: Pregnant    Other Topics Concern  . None   Social History Narrative  .  None    Family History  Problem Relation Age of Onset  . Hypertension Father   . Ovarian cancer Maternal Grandmother     Prescriptions Prior to Admission  Medication Sig Dispense Refill Last Dose  . aspirin EC 81 MG tablet Take 1 tablet (81 mg total) by mouth daily. Take after 12 weeks for prevention of preeclampssia later in pregnancy 300 tablet 2 07/11/2016 at Unknown time  . Prenatal Vit-Fe Fumarate-FA (PRENATAL MULTIVITAMIN) TABS tablet Take 1 tablet by mouth daily at 12 noon.   07/11/2016 at Unknown time    No Known Allergies  Review of Systems: Negative except for what is mentioned in HPI.  Physical Exam: BP (!) 141/89   LMP 10/29/2015  GENERAL: Well-developed, well-nourished female in no acute distress.  LUNGS: Clear to auscultation bilaterally.  HEART: Regular rate and rhythm. ABDOMEN: Soft, nontender, nondistended, gravid. EXTREMITIES: Nontender, no edema, 2+ distal pulses. Cervical Exam: Dilation: 1.5 Effacement (%): 80 Station: -2 Presentation: Vertex Exam by:: De Blanch, RN FHT:  Baseline rate 150 bpm   Variability moderate  Accelerations present   Decelerations none Contractions: Every 3-5 mins   Pertinent Labs/Studies:   Results for orders placed or performed in visit on 07/11/16 (from the past 24 hour(s))  POCT urinalysis dipstick  Status: None   Collection Time: 07/11/16  9:59 AM  Result Value Ref Range   Color, UA yellow    Clarity, UA clear    Glucose, UA trace    Bilirubin, UA negative    Ketones, UA negative    Spec Grav, UA 1.015    Blood, UA negative    pH, UA 6.0    Protein, UA negative    Urobilinogen, UA negative    Nitrite, UA negative    Leukocytes, UA Negative Negative    Assessment : Diana Stanley is a 35 y.o. [id number] at 7176w3d being admitted for labor. Desires TOLAC  Plan: Labor: Expectant management.  Induction/Augmentation as needed, per protocol FWB: Reassuring fetal heart tracing.  GBS positive Delivery plan: Hopeful for  vaginal delivery  Diana Stanley, CNM [facility]

## 2016-07-13 LAB — CBC
HEMATOCRIT: 27.4 % — AB (ref 35.0–47.0)
Hemoglobin: 9.2 g/dL — ABNORMAL LOW (ref 12.0–16.0)
MCH: 25.8 pg — ABNORMAL LOW (ref 26.0–34.0)
MCHC: 33.5 g/dL (ref 32.0–36.0)
MCV: 77 fL — AB (ref 80.0–100.0)
Platelets: 210 10*3/uL (ref 150–440)
RBC: 3.56 MIL/uL — ABNORMAL LOW (ref 3.80–5.20)
RDW: 14.8 % — ABNORMAL HIGH (ref 11.5–14.5)
WBC: 16.1 10*3/uL — ABNORMAL HIGH (ref 3.6–11.0)

## 2016-07-13 LAB — RPR: RPR: NONREACTIVE

## 2016-07-13 MED ORDER — IBUPROFEN 600 MG PO TABS
600.0000 mg | ORAL_TABLET | Freq: Four times a day (QID) | ORAL | Status: DC
  Administered 2016-07-13 – 2016-07-14 (×5): 600 mg via ORAL
  Filled 2016-07-13 (×5): qty 1

## 2016-07-13 MED ORDER — INFLUENZA VAC SPLIT QUAD 0.5 ML IM SUSY: 0.5000 mL | PREFILLED_SYRINGE | INTRAMUSCULAR | Status: DC

## 2016-07-13 MED ORDER — INFLUENZA VAC SPLIT QUAD 0.5 ML IM SUSY
0.5000 mL | PREFILLED_SYRINGE | INTRAMUSCULAR | Status: AC
  Administered 2016-07-14: 0.5 mL via INTRAMUSCULAR
  Filled 2016-07-13: qty 0.5

## 2016-07-13 NOTE — Lactation Note (Signed)
This note was copied from a baby's chart. Lactation Consultation Note  Patient Name: Diana Nicanor AlconRuth Flom BMWUX'LToday's Date: 07/13/2016 Reason for consult: Initial assessment   Maternal Data Formula Feeding for Exclusion: No  Spoke with mother and breast feeding is going well. Breast feeding basics reviewed with emphasis on the latch. As mother stated that her nipple was not always round when she came off the breast .         Consult Status      Diana Stanley 07/13/2016, 11:55 AM

## 2016-07-13 NOTE — Progress Notes (Signed)
Post Partum Day 1 Subjective: no complaints, up ad lib and voiding  Objective: Blood pressure 118/75, pulse 96, temperature 98.1 F (36.7 C), temperature source Oral, resp. rate 18, last menstrual period 10/29/2015, SpO2 99 %, unknown if currently breastfeeding.  Physical Exam:  General: alert, cooperative and appears stated age Lochia: appropriate Uterine Fundus: firm Incision: NA DVT Evaluation: No evidence of DVT seen on physical exam. Negative Homan's sign.   Recent Labs  07/12/16 0742 07/13/16 0454  HGB 11.3* 9.2*  HCT 33.6* 27.4*    Assessment/Plan: Plan for discharge tomorrow and Breastfeeding Infant feeding Breast;    LOS: 1 day   Diana Stanley, 07/13/2016, 8:15 AM

## 2016-07-14 ENCOUNTER — Encounter: Payer: 59 | Admitting: Obstetrics and Gynecology

## 2016-07-14 NOTE — Discharge Instructions (Signed)
Call your doctor for increased pain or vaginal bleeding, temperature above 100.4, depression, or concerns.  No strenuous activity or heavy lifting for 6 weeks.  No intercourse, tampons, douching, or enemas for 6 weeks.  No tub baths-showers only.  No driving for 2 weeks or while taking pain medications.  Continue prenatal vitamin and iron.  Increase calories and fluids while breastfeeding. °

## 2016-07-14 NOTE — Progress Notes (Signed)
Discharge instructions provided.  Pt verbalizes understanding of all instructions and follow-up care. Pt discharged to stay in room with infant. Reynold BowenSusan Paisley Bodey Frizell, RN 07/14/2016 6:59 PM

## 2016-07-14 NOTE — Discharge Summary (Signed)
Obstetric Discharge Summary Reason for Admission: onset of labor Prenatal Procedures: ultrasound Intrapartum Procedures: spontaneous vaginal delivery, GBS prophylaxis and VBAC Postpartum Procedures: Rubella Ig and varicella and flu vaccine Complications-Operative and Postpartum: anemia Hemoglobin  Date Value Ref Range Status  07/13/2016 9.2 (L) 12.0 - 16.0 g/dL Final   HCT  Date Value Ref Range Status  07/13/2016 27.4 (L) 35.0 - 47.0 % Final   Hematocrit  Date Value Ref Range Status  04/18/2016 33.4 (L) 34.0 - 46.6 % Final    Physical Exam:  General: alert, cooperative and appears stated age 40Lochia: appropriate Uterine Fundus: firm Incision: NA DVT Evaluation: No evidence of DVT seen on physical exam. Negative Homan's sign.  Discharge Diagnoses: Term Pregnancy-delivered  Discharge Information: Date: 07/14/2016 Activity: pelvic rest Diet: routine Medications: PNV, Ibuprofen and Colace Condition: stable Instructions: refer to practice specific booklet Discharge to: home   Newborn Data: Live born female  Birth Weight: 7 lb 8.6 oz (3420 g) APGAR: 8, 9  Home with mother.  Melody N Shambley 07/14/2016, 8:34 AM

## 2016-07-14 NOTE — Progress Notes (Signed)
Pt states that she received TDaP vaccine during pregnancy.  Pt declines TDaP vaccine at this time. Reynold BowenSusan Paisley Laporsche Hoeger, RN 07/14/2016 3:12 PM

## 2016-07-18 ENCOUNTER — Telehealth: Payer: Self-pay | Admitting: Obstetrics and Gynecology

## 2016-07-18 ENCOUNTER — Other Ambulatory Visit: Payer: Self-pay | Admitting: Obstetrics and Gynecology

## 2016-07-18 MED ORDER — FUSION PLUS PO CAPS: 1.0000 | ORAL_CAPSULE | Freq: Every day | ORAL | 1 refills | Status: DC

## 2016-07-18 NOTE — Telephone Encounter (Signed)
What would you like to give pt??

## 2016-07-18 NOTE — Telephone Encounter (Signed)
Pt called and she said [facility] was suppose to give her iron pills and she was calling because the pharmacy does not have anything, she uses [facility] [place].

## 2016-08-24 ENCOUNTER — Ambulatory Visit (INDEPENDENT_AMBULATORY_CARE_PROVIDER_SITE_OTHER): Payer: 59 | Admitting: Obstetrics and Gynecology

## 2016-08-24 ENCOUNTER — Encounter: Payer: Self-pay | Admitting: Obstetrics and Gynecology

## 2016-08-24 VITALS — BP 131/77 | HR 84 | Ht 65.0 in | Wt 140.9 lb

## 2016-08-24 DIAGNOSIS — Z30430 Encounter for insertion of intrauterine contraceptive device: Secondary | ICD-10-CM

## 2016-08-24 MED ORDER — LEVONORGESTREL 20 MCG/24HR IU IUD: 1.0000 | INTRAUTERINE_SYSTEM | Freq: Once | INTRAUTERINE | 0 refills | Status: AC

## 2016-08-24 NOTE — Patient Instructions (Signed)
IUD PLACEMENT POST-PROCEDURE INSTRUCTIONS  1. You may take Ibuprofen, Aleve or Tylenol for pain if needed.  Cramping should resolve within in 24 hours.  2. You may have a small amount of spotting.  You should wear a mini pad for the next few days.  3. You may have intercourse after 24 hours.  If you using this for birth control, it is effective immediately.  4. You need to call if you have any pelvic pain, fever, heavy bleeding or foul smelling vaginal discharge.  Irregular bleeding is common the first several months after having an IUD placed. You do not need to call for this reason unless you are concerned.  5. Shower or bathe as normal   

## 2016-08-24 NOTE — Progress Notes (Signed)
Diana HansenRuth R Stanley is a 35 y.o. year old [id number] Caucasian female who presents for placement of a Mirena IUD.  No LMP recorded. BP 131/77   Pulse 84   Ht 5\' 5"  (1.651 m)   Wt 140 lb 14.4 oz (63.9 kg)   Breastfeeding? Yes   BMI 23.45 kg/m    The risks and benefits of the method and placement have been thouroughly reviewed with the patient and all questions were answered.  Specifically the patient is aware of failure rate of 10/998, expulsion of the IUD and of possible perforation.  The patient is aware of irregular bleeding due to the method and understands the incidence of irregular bleeding diminishes with time.  Signed copy of informed consent in chart.   Time out was performed.  A graves speculum was placed in the vagina.  The cervix was visualized, prepped using Betadine, and grasped with a single tooth tenaculum. The uterus was found to be neutral and it sounded to 8 cm.  Mirena IUD placed per manufacturer's recommendations.   The strings were trimmed to 3 cm.  The patient was given post procedure instructions, including signs and symptoms of infection and to check for the strings after each menses or each month, and refraining from intercourse or anything in the vagina for 3 days.  She was given a Mirena care card with date Mirena placed, and date Mirena to be removed.    Diana Stanley Diana Stanley, CNM

## 2016-08-30 ENCOUNTER — Encounter: Payer: Self-pay | Admitting: Obstetrics and Gynecology

## 2016-08-30 ENCOUNTER — Ambulatory Visit (INDEPENDENT_AMBULATORY_CARE_PROVIDER_SITE_OTHER): Payer: 59 | Admitting: Obstetrics and Gynecology

## 2016-08-30 NOTE — Progress Notes (Signed)
   Subjective:     Earvin HansenRuth R Medeiros is a 35 y.o. female who presents for a postpartum visit. She is 6 week postpartum following a spontaneous vaginal delivery. I have fully reviewed the prenatal and intrapartum course. The delivery was at 40 gestational weeks. Outcome: spontaneous vaginal delivery. Anesthesia: none. Postpartum course has been uncomplicated. Baby's course has been uncomplicated. Baby is feeding by breast. Bleeding no bleeding. Bowel function is normal. Bladder function is normal. Patient is not sexually active. Contraception method is abstinence and IUD. Postpartum depression screening: negative. Had IUD inserted 08/24/16 without complication.  The following portions of the patient's history were reviewed and updated as appropriate: allergies, current medications, past family history, past medical history, past social history, past surgical history and problem list.  Review of Systems Pertinent items noted in HPI and remainder of comprehensive ROS otherwise negative.   Objective:    BP 113/69   Pulse 67   Ht 5\' 5"  (1.651 m)   Wt 145 lb 8 oz (66 kg)   Breastfeeding? Yes   BMI 24.21 kg/m   General:  alert, cooperative and appears stated age   Breasts:  inspection negative, no nipple discharge or bleeding, no masses or nodularity palpable  Lungs: clear to auscultation bilaterally  Heart:  regular rate and rhythm, S1, S2 normal, no murmur, click, rub or gallop  Abdomen: soft, non-tender; bowel sounds normal; no masses,  no organomegaly   Vulva:  normal  Vagina: normal vagina, no discharge, exudate, lesion, or erythema  Cervix:  no cervical motion tenderness and IUD string felt  Corpus: normal size, contour, position, consistency, mobility, non-tender  Adnexa:  no mass, fullness, tenderness  Rectal Exam: Normal rectovaginal exam        Assessment:   Normal postpartum exam. Pap smear not done at today's visit. IUD  Plan:    1. Contraception: IUD 2. Continue  breastfeeding 3. Follow up in: 4 months or as needed.

## 2016-08-30 NOTE — Patient Instructions (Signed)
  Place postpartum visit patient instructions here.  

## 2016-08-31 ENCOUNTER — Other Ambulatory Visit: Payer: Self-pay | Admitting: Obstetrics and Gynecology

## 2016-08-31 DIAGNOSIS — E559 Vitamin D deficiency, unspecified: Secondary | ICD-10-CM

## 2016-08-31 LAB — CBC
HEMOGLOBIN: 12.2 g/dL (ref 11.1–15.9)
Hematocrit: 37.6 % (ref 34.0–46.6)
MCH: 25.6 pg — AB (ref 26.6–33.0)
MCHC: 32.4 g/dL (ref 31.5–35.7)
MCV: 79 fL (ref 79–97)
PLATELETS: 336 10*3/uL (ref 150–379)
RBC: 4.76 x10E6/uL (ref 3.77–5.28)
RDW: 15.9 % — ABNORMAL HIGH (ref 12.3–15.4)
WBC: 7.3 10*3/uL (ref 3.4–10.8)

## 2016-08-31 LAB — IRON: IRON: 27 ug/dL (ref 27–159)

## 2016-08-31 LAB — VITAMIN D 25 HYDROXY (VIT D DEFICIENCY, FRACTURES): VIT D 25 HYDROXY: 28 ng/mL — AB (ref 30.0–100.0)

## 2016-08-31 MED ORDER — VITAMIN D (ERGOCALCIFEROL) 1.25 MG (50000 UNIT) PO CAPS: 50000.0000 [IU] | ORAL_CAPSULE | ORAL | 2 refills | Status: DC

## 2016-09-01 ENCOUNTER — Telehealth: Payer: Self-pay | Admitting: *Deleted

## 2016-09-01 NOTE — Telephone Encounter (Signed)
Notified pt of labs.

## 2016-09-01 NOTE — Telephone Encounter (Signed)
-----   Message from Purcell NailsMelody N Shambley, [place] sent at 08/31/2016  4:55 PM EST ----- Please let her know she is not anemic, can stop iron. To continue PNV. Is low vit D, I sent in rx for 2 times weekly supplement (to switch to it from OTC)

## 2016-09-12 ENCOUNTER — Encounter: Payer: 59 | Admitting: Obstetrics and Gynecology

## 2016-12-27 ENCOUNTER — Ambulatory Visit (INDEPENDENT_AMBULATORY_CARE_PROVIDER_SITE_OTHER): Payer: 59 | Admitting: Obstetrics and Gynecology

## 2016-12-27 ENCOUNTER — Encounter: Payer: Self-pay | Admitting: Obstetrics and Gynecology

## 2016-12-27 ENCOUNTER — Other Ambulatory Visit: Payer: Self-pay | Admitting: Obstetrics and Gynecology

## 2016-12-27 VITALS — BP 128/84 | HR 104 | Ht 65.0 in | Wt 145.0 lb

## 2016-12-27 DIAGNOSIS — Z01419 Encounter for gynecological examination (general) (routine) without abnormal findings: Secondary | ICD-10-CM

## 2016-12-27 DIAGNOSIS — E559 Vitamin D deficiency, unspecified: Secondary | ICD-10-CM

## 2016-12-27 DIAGNOSIS — Z30431 Encounter for routine checking of intrauterine contraceptive device: Secondary | ICD-10-CM | POA: Diagnosis not present

## 2016-12-27 NOTE — Patient Instructions (Signed)
 Preventive Care 18-39 Years, Female Preventive care refers to lifestyle choices and visits with your health care provider that can promote health and wellness. What does preventive care include?  A yearly physical exam. This is also called an annual well check.  Dental exams once or twice a year.  Routine eye exams. Ask your health care provider how often you should have your eyes checked.  Personal lifestyle choices, including:  Daily care of your teeth and gums.  Regular physical activity.  Eating a healthy diet.  Avoiding tobacco and drug use.  Limiting alcohol use.  Practicing safe sex.  Taking vitamin and mineral supplements as recommended by your health care provider. What happens during an annual well check? The services and screenings done by your health care provider during your annual well check will depend on your age, overall health, lifestyle risk factors, and family history of disease. Counseling  Your health care provider may ask you questions about your:  Alcohol use.  Tobacco use.  Drug use.  Emotional well-being.  Home and relationship well-being.  Sexual activity.  Eating habits.  Work and work environment.  Method of birth control.  Menstrual cycle.  Pregnancy history. Screening  You may have the following tests or measurements:  Height, weight, and BMI.  Diabetes screening. This is done by checking your blood sugar (glucose) after you have not eaten for a while (fasting).  Blood pressure.  Lipid and cholesterol levels. These may be checked every 5 years starting at age 20.  Skin check.  Hepatitis C blood test.  Hepatitis B blood test.  Sexually transmitted disease (STD) testing.  BRCA-related cancer screening. This may be done if you have a family history of breast, ovarian, tubal, or peritoneal cancers.  Pelvic exam and Pap test. This may be done every 3 years starting at age 21. Starting at age 30, this may be done  every 5 years if you have a Pap test in combination with an HPV test. Discuss your test results, treatment options, and if necessary, the need for more tests with your health care provider. Vaccines  Your health care provider may recommend certain vaccines, such as:  Influenza vaccine. This is recommended every year.  Tetanus, diphtheria, and acellular pertussis (Tdap, Td) vaccine. You may need a Td booster every 10 years.  Varicella vaccine. You may need this if you have not been vaccinated.  HPV vaccine. If you are 26 or younger, you may need three doses over 6 months.  Measles, mumps, and rubella (MMR) vaccine. You may need at least one dose of MMR. You may also need a second dose.  Pneumococcal 13-valent conjugate (PCV13) vaccine. You may need this if you have certain conditions and were not previously vaccinated.  Pneumococcal polysaccharide (PPSV23) vaccine. You may need one or two doses if you smoke cigarettes or if you have certain conditions.  Meningococcal vaccine. One dose is recommended if you are age 19-21 years and a first-year college student living in a residence hall, or if you have one of several medical conditions. You may also need additional booster doses.  Hepatitis A vaccine. You may need this if you have certain conditions or if you travel or work in places where you may be exposed to hepatitis A.  Hepatitis B vaccine. You may need this if you have certain conditions or if you travel or work in places where you may be exposed to hepatitis B.  Haemophilus influenzae type b (Hib) vaccine. You may need   this if you have certain risk factors. Talk to your health care provider about which screenings and vaccines you need and how often you need them. This information is not intended to replace advice given to you by your health care provider. Make sure you discuss any questions you have with your health care provider. Document Released: 12/05/2001 Document Revised:  06/28/2016 Document Reviewed: 08/10/2015 Elsevier Interactive Patient Education  2017 Elsevier Inc.  

## 2016-12-27 NOTE — Progress Notes (Signed)
Subjective:   Diana HansenRuth R Stanley is a 36 y.o. [phone] Caucasian female here for a routine well-woman exam.  No LMP recorded. Patient is not currently having periods (Reason: IUD).    Current complaints: lack of weight loss, is exercising 4-5 days a week, still breast feeding. PCP: me       Does need labs  Social History: Sexual: heterosexual Marital Status: married Living situation: with family Occupation: homemaker Tobacco/alcohol: no tobacco use Illicit drugs: no history of illicit drug use  The following portions of the patient's history were reviewed and updated as appropriate: allergies, current medications, past family history, past medical history, past social history, past surgical history and problem list.  Past Medical History History reviewed. No pertinent past medical history.  Past Surgical History Past Surgical History:  Procedure Laterality Date  . CESAREAN SECTION N/A 01/21/2014   Procedure:    STAT CESAREAN SECTION;  Surgeon: Diana NailsAngela Y Roberts, MD;  Location: [facility];  Service: Obstetrics;  Laterality: N/A;    Gynecologic History [id number]  No LMP recorded. Patient is not currently having periods (Reason: IUD). Contraception: IUD Last Pap: 2014. Results were: normal  Obstetric History OB History  Gravida Para Term Preterm AB Living  3 3 2 1  0 3  SAB TAB Ectopic Multiple Live Births  0 0 0 0 3    # Outcome Date GA Lbr Len/2nd Weight Sex Delivery Anes PTL Lv  3 Term 07/12/16 2535w3d / 00:04 7 lb 8.6 oz (3.42 kg) F Vag-Spont   LIV  2 Preterm 01/21/14 2422w5d  4 lb 11.8 oz (2.15 kg) M CS-LTranv Spinal  LIV     Complications: Abruptio Placenta  1 Term 2012   7 lb 3.2 oz (3.266 kg) M Vag-Spont   LIV    Obstetric Comments  No etiology to Abruption identified    Current Medications Current Outpatient Prescriptions on File Prior to Visit  Medication Sig Dispense Refill  . levonorgestrel (MIRENA) 20 MCG/24HR IUD 1 each by Intrauterine route once.    . Prenatal Vit-Fe  Fumarate-FA (PRENATAL MULTIVITAMIN) TABS tablet Take 1 tablet by mouth daily at 12 noon.    . Vitamin D, Ergocalciferol, (DRISDOL) 50000 units CAPS capsule Take 1 capsule (50,000 Units total) by mouth 2 (two) times a week. 30 capsule 2  . levonorgestrel (MIRENA) 20 MCG/24HR IUD 1 Intra Uterine Device (1 each total) by Intrauterine route once. 1 each 0   No current facility-administered medications on file prior to visit.     Review of Systems Patient denies any headaches, blurred vision, shortness of breath, chest pain, abdominal pain, problems with bowel movements, urination, or intercourse.  Objective:  BP 128/84   Pulse (!) 104   Ht 5\' 5"  (1.651 m)   Wt 145 lb (65.8 kg)   Breastfeeding? Yes   BMI 24.13 kg/m  Physical Exam  General:  Well developed, well nourished, no acute distress. She is alert and oriented x3. Skin:  Warm and dry Neck:  Midline trachea, no thyromegaly or nodules Cardiovascular: Regular rate and rhythm, no murmur heard Lungs:  Effort normal, all lung fields clear to auscultation bilaterally Breasts:  No dominant palpable mass, retraction, or nipple discharge Abdomen:  Soft, non tender, no hepatosplenomegaly or masses Pelvic:  External genitalia is normal in appearance.  The vagina is normal in appearance. The cervix is bulbous, no CMT.  Thin prep pap is done with HR HPV cotesting. Uterus is felt to be normal size, shape, and contour.  No adnexal  masses or tenderness noted. IUD string just inside cervix. Extremities:  No swelling or varicosities noted Psych:  She has a normal mood and affect  Assessment:   Healthy well-woman exam IUD check Vit d deficiency lactating  Plan:  Labs obtained F/U 1 year for AE, or sooner if needed   Diana Stanley Diana Stanley, CNM

## 2016-12-28 ENCOUNTER — Other Ambulatory Visit: Payer: Self-pay | Admitting: Obstetrics and Gynecology

## 2016-12-28 ENCOUNTER — Telehealth: Payer: Self-pay | Admitting: *Deleted

## 2016-12-28 DIAGNOSIS — R7989 Other specified abnormal findings of blood chemistry: Secondary | ICD-10-CM

## 2016-12-28 LAB — COMPREHENSIVE METABOLIC PANEL
A/G RATIO: 1.6 (ref 1.2–2.2)
ALK PHOS: 86 IU/L (ref 39–117)
ALT: 15 IU/L (ref 0–32)
AST: 17 IU/L (ref 0–40)
Albumin: 4.1 g/dL (ref 3.5–5.5)
BILIRUBIN TOTAL: 0.3 mg/dL (ref 0.0–1.2)
BUN/Creatinine Ratio: 19 (ref 9–23)
BUN: 13 mg/dL (ref 6–20)
CHLORIDE: 101 mmol/L (ref 96–106)
CO2: 26 mmol/L (ref 18–29)
Calcium: 9.8 mg/dL (ref 8.7–10.2)
Creatinine, Ser: 0.7 mg/dL (ref 0.57–1.00)
GFR calc non Af Amer: 113 mL/min/{1.73_m2} (ref >59)
GFR, EST AFRICAN AMERICAN: 130 mL/min/{1.73_m2} (ref >59)
Globulin, Total: 2.5 g/dL (ref 1.5–4.5)
Glucose: 86 mg/dL (ref 65–99)
POTASSIUM: 4.7 mmol/L (ref 3.5–5.2)
Sodium: 141 mmol/L (ref 134–144)
Total Protein: 6.6 g/dL (ref 6.0–8.5)

## 2016-12-28 LAB — LIPID PANEL
CHOL/HDL RATIO: 2.7 ratio (ref 0.0–4.4)
Cholesterol, Total: 190 mg/dL (ref 100–199)
HDL: 70 mg/dL (ref >39)
LDL CALC: 109 mg/dL — AB (ref 0–99)
TRIGLYCERIDES: 54 mg/dL (ref 0–149)
VLDL CHOLESTEROL CAL: 11 mg/dL (ref 5–40)

## 2016-12-28 LAB — TSH: TSH: <0.006 u[IU]/mL — ABNORMAL LOW (ref 0.450–4.500)

## 2016-12-28 LAB — VITAMIN D 25 HYDROXY (VIT D DEFICIENCY, FRACTURES): VIT D 25 HYDROXY: 26 ng/mL — AB (ref 30.0–100.0)

## 2016-12-28 MED ORDER — VITAMIN D (ERGOCALCIFEROL) 1.25 MG (50000 UNIT) PO CAPS: 50000.0000 [IU] | ORAL_CAPSULE | ORAL | 2 refills | Status: DC

## 2016-12-28 NOTE — Telephone Encounter (Signed)
-----   Message from Purcell NailsMelody N Shambley, [place] sent at 12/28/2016 10:17 AM EST ----- Please let her know lab results, and that I sent in a new rx for Vit as she needs to keep taking it. Also her TSH is too low, I want her to come back in within a week to draw a full thyroid panel (order is in).

## 2016-12-28 NOTE — Telephone Encounter (Signed)
Mailed labs to pt

## 2016-12-29 LAB — CYTOLOGY - PAP

## 2017-01-04 ENCOUNTER — Other Ambulatory Visit: Payer: Self-pay | Admitting: Obstetrics and Gynecology

## 2017-01-04 ENCOUNTER — Other Ambulatory Visit: Payer: 59

## 2017-01-05 LAB — THYROID PANEL WITH TSH
Free Thyroxine Index: 2.8 (ref 1.2–4.9)
T3 Uptake Ratio: 33 % (ref 24–39)
T4, Total: 8.5 ug/dL (ref 4.5–12.0)
TSH: <0.006 u[IU]/mL — ABNORMAL LOW (ref 0.450–4.500)

## 2017-01-09 ENCOUNTER — Telehealth: Payer: Self-pay | Admitting: *Deleted

## 2017-01-09 NOTE — Telephone Encounter (Signed)
-----   Message from Purcell NailsMelody N Shambley, [place] sent at 01/09/2017 11:37 AM EDT ----- Please let her know lab didn't change, have her follow up with PCP or endocrinologist (which ever treats thyroid)

## 2017-01-09 NOTE — Telephone Encounter (Signed)
Mailed pt info about labs 

## 2017-03-26 ENCOUNTER — Ambulatory Visit (INDEPENDENT_AMBULATORY_CARE_PROVIDER_SITE_OTHER): Payer: BLUE CROSS/BLUE SHIELD | Admitting: Family

## 2017-03-26 ENCOUNTER — Encounter: Payer: Self-pay | Admitting: Family

## 2017-03-26 VITALS — BP 128/80 | HR 72 | Temp 98.0°F | Ht 65.0 in | Wt 142.6 lb

## 2017-03-26 DIAGNOSIS — E039 Hypothyroidism, unspecified: Secondary | ICD-10-CM | POA: Diagnosis not present

## 2017-03-26 LAB — TSH: TSH: 0.76 u[IU]/mL (ref 0.35–4.50)

## 2017-03-26 LAB — T3, FREE: T3 FREE: 3.9 pg/mL (ref 2.3–4.2)

## 2017-03-26 LAB — T4, FREE: FREE T4: 0.87 ng/dL (ref 0.60–1.60)

## 2017-03-26 NOTE — Progress Notes (Signed)
Pre visit review using our clinic review tool, if applicable. No additional management support is needed unless otherwise documented below in the visit note. 

## 2017-03-26 NOTE — Progress Notes (Signed)
Subjective:    Patient ID: Diana Stanley, female    DOB: 28-Oct-1980, 36 y.o.   MRN: [id number]  CC: Diana Stanley is a 36 y.o. female who presents today to establish care.    HPI: Prior primary care had been with CNM and plans to continue to women care there.   Establishing today due to concern for thyroid labs,   Thyroid labs 2 months ago had been 'off' and wanted to follow up. Does note some fatigue. No cold/heat intolerance, palpitations.        HISTORY:  No past medical history on file. Past Surgical History:  Procedure Laterality Date  . CESAREAN SECTION N/A 01/21/2014   Procedure:    STAT CESAREAN SECTION;  Surgeon: Purcell Nails, MD;  Location: [facility];  Service: Obstetrics;  Laterality: N/A;   Family History  Problem Relation Age of Onset  . Hypertension Father   . Ovarian cancer Maternal Grandmother 38  . Stroke Paternal Grandfather   . Bladder Cancer Maternal Uncle 21  . Breast cancer Neg Hx     Allergies: Pollen extract Current Outpatient Prescriptions on File Prior to Visit  Medication Sig Dispense Refill  . levonorgestrel (MIRENA) 20 MCG/24HR IUD 1 each by Intrauterine route once.    . Prenatal Vit-Fe Fumarate-FA (PRENATAL MULTIVITAMIN) TABS tablet Take 1 tablet by mouth daily at 12 noon.    . Vitamin D, Ergocalciferol, (DRISDOL) 50000 units CAPS capsule Take 1 capsule (50,000 Units total) by mouth 2 (two) times a week. 30 capsule 2  . levonorgestrel (MIRENA) 20 MCG/24HR IUD 1 Intra Uterine Device (1 each total) by Intrauterine route once. 1 each 0   No current facility-administered medications on file prior to visit.     Social History  Substance Use Topics  . Smoking status: Never Smoker  . Smokeless tobacco: Never Used  . Alcohol use 0.5 oz/week    1 Standard drinks or equivalent per week    Review of Systems  Constitutional: Negative for chills and fever.  Respiratory: Negative for cough.   Cardiovascular: Negative for chest pain and  palpitations.  Gastrointestinal: Negative for nausea and vomiting.  Endocrine: Negative for cold intolerance and heat intolerance.      Objective:    BP 128/80   Pulse 72   Temp 98 F (36.7 C) (Oral)   Ht 5\' 5"  (1.651 m)   Wt 142 lb 9.6 oz (64.7 kg)   SpO2 98%   BMI 23.73 kg/m  BP Readings from Last 3 Encounters:  03/26/17 128/80  12/27/16 128/84  08/30/16 113/69   Wt Readings from Last 3 Encounters:  03/26/17 142 lb 9.6 oz (64.7 kg)  12/27/16 145 lb (65.8 kg)  08/30/16 145 lb 8 oz (66 kg)    Physical Exam  Constitutional: She appears well-developed and well-nourished.  Eyes: Conjunctivae are normal.  Neck: No thyroid mass and no thyromegaly present.  Cardiovascular: Normal rate, regular rhythm, normal heart sounds and normal pulses.   Pulmonary/Chest: Effort normal and breath sounds normal. She has no wheezes. She has no rhonchi. She has no rales.  Neurological: She is alert.  Skin: Skin is warm and dry.  Psychiatric: She has a normal mood and affect. Her speech is normal and behavior is normal. Thought content normal.  Vitals reviewed.      Assessment & Plan:   Problem List Items Addressed This Visit      Endocrine   Hypothyroidism - Primary    Clinically asymptomatic. Pending  thyroid studies          I am having Ms. Troyer maintain her prenatal multivitamin, levonorgestrel, levonorgestrel, and Vitamin D (Ergocalciferol).   No orders of the defined types were placed in this encounter.   Return precautions given.   Risks, benefits, and alternatives of the medications and treatment plan prescribed today were discussed, and patient expressed understanding.   Education regarding symptom management and diagnosis given to patient on AVS.  Continue to follow with Allegra GranaArnett, Lyncoln Maskell G, FNP for routine health maintenance.   Earvin Hansenuth R Reiley and I agreed with plan.   Rennie PlowmanMargaret Terrie Haring, FNP

## 2017-03-26 NOTE — Assessment & Plan Note (Signed)
Clinically asymptomatic. Pending thyroid studies 

## 2017-03-26 NOTE — Patient Instructions (Signed)
Labs today Pleasure meeting you 

## 2018-01-01 ENCOUNTER — Ambulatory Visit (INDEPENDENT_AMBULATORY_CARE_PROVIDER_SITE_OTHER): Payer: BLUE CROSS/BLUE SHIELD | Admitting: Certified Nurse Midwife

## 2018-01-01 VITALS — BP 136/63 | HR 84 | Ht 65.0 in | Wt 143.5 lb

## 2018-01-01 DIAGNOSIS — N898 Other specified noninflammatory disorders of vagina: Secondary | ICD-10-CM | POA: Diagnosis not present

## 2018-01-01 DIAGNOSIS — R300 Dysuria: Secondary | ICD-10-CM | POA: Diagnosis not present

## 2018-01-01 LAB — POCT URINALYSIS DIPSTICK
Bilirubin, UA: NEGATIVE
Glucose, UA: NEGATIVE
Ketones, UA: NEGATIVE
Leukocytes, UA: NEGATIVE
NITRITE UA: NEGATIVE
Protein, UA: NEGATIVE
RBC UA: NEGATIVE
Urobilinogen, UA: 0.2 E.U./dL
pH, UA: 5 (ref 5.0–8.0)

## 2018-01-01 NOTE — Patient Instructions (Signed)
How to Take a Sitz Bath A sitz bath is a warm water bath that is taken while you are sitting down. The water should only come up to your hips and should cover your buttocks. Your health care provider may recommend a sitz bath to help you:  Clean the lower part of your body, including your genital area.  With itching.  With pain.  With sore muscles or muscles that tighten or spasm.  How to take a sitz bath Take 3-4 sitz baths per day or as told by your health care provider. 1. Partially fill a bathtub with warm water. You will only need the water to be deep enough to cover your hips and buttocks when you are sitting in it. 2. If your health care provider told you to put medicine in the water, follow the directions exactly. 3. Sit in the water and open the tub drain a little. 4. Turn on the warm water again to keep the tub at the correct level. Keep the water running constantly. 5. Soak in the water for 15-20 minutes or as told by your health care provider. 6. After the sitz bath, pat the affected area dry first. Do not rub it. 7. Be careful when you stand up after the sitz bath because you may feel dizzy.  Contact a health care provider if:  Your symptoms get worse. Do not continue with sitz baths if your symptoms get worse.  You have new symptoms. Do not continue with sitz baths until you talk with your health care provider. This information is not intended to replace advice given to you by your health care provider. Make sure you discuss any questions you have with your health care provider. Document Released: 07/01/2004 Document Revised: 03/08/2016 Document Reviewed: 10/07/2014 Elsevier Interactive Patient Education  2018 [employer] Dysuria Dysuria is pain or discomfort while urinating. The pain or discomfort may be felt in the tube that carries urine out of the bladder (urethra) or in the surrounding tissue of the genitals. The pain may also be felt in the groin area, lower  abdomen, and lower back. You may have to urinate frequently or have the sudden feeling that you have to urinate (urgency). Dysuria can affect both men and women, but is more common in women. Dysuria can be caused by many different things, including:  Urinary tract infection in women.  Infection of the kidney or bladder.  Kidney stones or bladder stones.  Certain sexually transmitted infections (STIs), such as chlamydia.  Dehydration.  Inflammation of the vagina.  Use of certain medicines.  Use of certain soaps or scented products that cause irritation.  Follow these instructions at home: Watch your dysuria for any changes. The following actions may help to reduce any discomfort you are feeling:  Drink enough fluid to keep your urine clear or pale yellow.  Empty your bladder often. Avoid holding urine for long periods of time.  After a bowel movement or urination, women should cleanse from front to back, using each tissue only once.  Empty your bladder after sexual intercourse.  Take medicines only as directed by your health care provider.  If you were prescribed an antibiotic medicine, finish it all even if you start to feel better.  Avoid caffeine, tea, and alcohol. They can irritate the bladder and make dysuria worse. In men, alcohol may irritate the prostate.  Keep all follow-up visits as directed by your health care provider. This is important.  If you had any tests done  to find the cause of dysuria, it is your responsibility to obtain your test results. Ask the lab or department performing the test when and how you will get your results. Talk with your health care provider if you have any questions about your results.  Contact a health care provider if:  You develop pain in your back or sides.  You have a fever.  You have nausea or vomiting.  You have blood in your urine.  You are not urinating as often as you usually do. Get help right away if:  You pain is  severe and not relieved with medicines.  You are unable to hold down any fluids.  You or someone else notices a change in your mental function.  You have a rapid heartbeat at rest.  You have shaking or chills.  You feel extremely weak. This information is not intended to replace advice given to you by your health care provider. Make sure you discuss any questions you have with your health care provider. Document Released: 07/07/2004 Document Revised: 03/16/2016 Document Reviewed: 06/04/2014 Elsevier Interactive Patient Education  2018 [employer]. Vaginitis Vaginitis is an inflammation of the vagina. It can happen when the normal bacteria and yeast in the vagina grow too much. There are different types. Treatment will depend on the type you have. Follow these instructions at home:  Take all medicines as told by your doctor.  Keep your vagina area clean and dry. Avoid soap. Rinse the area with water.  Avoid washing and cleaning out the vagina (douching).  Do not use tampons or have sex (intercourse) until your treatment is done.  Wipe from front to back after going to the restroom.  Wear cotton underwear.  Avoid wearing underwear while you sleep until your vaginitis is gone.  Avoid tight pants. Avoid underwear or nylons without a cotton panel.  Take off wet clothing (such as a bathing suit) as soon as you can.  Use mild, unscented products. Avoid fabric softeners and scented: ? Feminine sprays. ? Laundry detergents. ? Tampons. ? Soaps or bubble baths.  Practice safe sex and use condoms. Get help right away if:  You have belly (abdominal) pain.  You have a fever or lasting symptoms for more than 2-3 days.  You have a fever and your symptoms suddenly get worse. This information is not intended to replace advice given to you by your health care provider. Make sure you discuss any questions you have with your health care provider. Document Released: 01/05/2009 Document  Revised: 03/16/2016 Document Reviewed: 03/21/2012 Elsevier Interactive Patient Education  2017 [employer].

## 2018-01-01 NOTE — Progress Notes (Signed)
Pt is here with c/o dysuria and vaginal itching.

## 2018-01-03 LAB — URINE CULTURE

## 2018-01-04 ENCOUNTER — Telehealth: Payer: Self-pay

## 2018-01-04 LAB — NUSWAB VAGINITIS (VG)
CANDIDA GLABRATA, NAA: NEGATIVE
Candida albicans, NAA: NEGATIVE
TRICH VAG BY NAA: NEGATIVE

## 2018-01-04 NOTE — Telephone Encounter (Signed)
Informed pt of providers instructions. Also states the home measures are working. Instructed to call office if she becomes symptomatic.

## 2018-01-04 NOTE — Progress Notes (Signed)
Please contact patient. NuSwab negative for infection. Small amount of bacteria in urine, but not enough to cause infection. However, if symptomatic may prescribe antibiotic. Have home treatment measures helped with irritation? Thanks, JML

## 2018-01-04 NOTE — Telephone Encounter (Signed)
-----   Message from Gunnar BullaJenkins Michelle Lawhorn, CNM sent at 01/04/2018  9:36 AM EDT ----- Please contact patient. NuSwab negative for infection. Small amount of bacteria in urine, but not enough to cause infection. However, if symptomatic may prescribe antibiotic. Have home treatment measures helped with irritation? Thanks, JML

## 2018-01-16 NOTE — Progress Notes (Signed)
GYN ENCOUNTER NOTE  Subjective:       Diana Stanley is a 37 y.o. [phone] female here for gynecologic evaluation of the following issues:  1. Dysuria 2. Vaginal itching  Reports intermittent vaginal itching and "stinging during urination" for the last month. Started daily spin classes, no other life changes.   Denies difficulty breathing or respiratory distress, chest pain, abdominal pain, vaginal bleeding or discharge, and leg pain or swelling.    Gynecologic History  No LMP recorded. (Menstrual status: IUD).  Contraception: IUD  Last Pap: 12/2016. Results were: Negative/Negative  Obstetric History  OB History  Gravida Para Term Preterm AB Living  3 3 2 1  0 3  SAB TAB Ectopic Multiple Live Births  0 0 0 0 3    # Outcome Date GA Lbr Len/2nd Weight Sex Delivery Anes PTL Lv  3 Term 07/12/16 [redacted]w[redacted]d / 00:04 7 lb 8.6 oz (3.42 kg) F Vag-Spont   LIV  2 Preterm 01/21/14 [redacted]w[redacted]d  4 lb 11.8 oz (2.15 kg) M CS-LTranv Spinal  LIV     Complications: Abruptio Placenta  1 Term 2012   7 lb 3.2 oz (3.266 kg) M Vag-Spont   LIV    Obstetric Comments  No etiology to Abruption identified    No past medical history on file.  Past Surgical History:  Procedure Laterality Date  . CESAREAN SECTION N/A 01/21/2014   Procedure:    STAT CESAREAN SECTION;  Surgeon: Purcell Nails, MD;  Location: [facility];  Service: Obstetrics;  Laterality: N/A;    Current Outpatient Medications on File Prior to Visit  Medication Sig Dispense Refill  . levonorgestrel (MIRENA) 20 MCG/24HR IUD 1 each by Intrauterine route once.    . Prenatal Vit-Fe Fumarate-FA (PRENATAL MULTIVITAMIN) TABS tablet Take 1 tablet by mouth daily at 12 noon.    . Vitamin D, Ergocalciferol, (DRISDOL) 50000 units CAPS capsule Take 1 capsule (50,000 Units total) by mouth 2 (two) times a week. 30 capsule 2  . levonorgestrel (MIRENA) 20 MCG/24HR IUD 1 Intra Uterine Device (1 each total) by Intrauterine route once. 1 each 0   No current  facility-administered medications on file prior to visit.     Allergies  Allergen Reactions  . Pollen Extract     Social History   Socioeconomic History  . Marital status: Married    Spouse name: Not on file  . Number of children: Not on file  . Years of education: Not on file  . Highest education level: Not on file  Occupational History  . Not on file  Social Needs  . Financial resource strain: Not on file  . Food insecurity:    Worry: Not on file    Inability: Not on file  . Transportation needs:    Medical: Not on file    Non-medical: Not on file  Tobacco Use  . Smoking status: Never Smoker  . Smokeless tobacco: Never Used  Substance and Sexual Activity  . Alcohol use: Yes    Alcohol/week: 0.5 oz    Types: 1 Standard drinks or equivalent per week  . Drug use: No  . Sexual activity: Yes    Birth control/protection: IUD    Comment: mirena  Lifestyle  . Physical activity:    Days per week: Not on file    Minutes per session: Not on file  . Stress: Not on file  Relationships  . Social connections:    Talks on phone: Not on file    Gets  together: Not on file    Attends religious service: Not on file    Active member of club or organization: Not on file    Attends meetings of clubs or organizations: Not on file    Relationship status: Not on file  . Intimate partner violence:    Fear of current or ex partner: Not on file    Emotionally abused: Not on file    Physically abused: Not on file    Forced sexual activity: Not on file  Other Topics Concern  . 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    Mom of 3   2 boys and girl       Married       Family History  Problem Relation Age of Onset  . Hypertension Father   . Heart disease Father   . Ovarian cancer Maternal Grandmother [phone]  . Stroke Paternal Grandfather   . Heart disease Paternal Grandfather   . Bladder Cancer Maternal Uncle [id number]  . Heart disease Paternal  Grandmother   . Breast cancer Neg Hx     The following portions of the patient's history were reviewed and updated as appropriate: allergies, current medications, past family history, past medical history, past social history, past surgical history and problem list.  Review of Systems  Review of Systems - Negative except as noted above.  History obtained from the patient.   Objective:   BP 136/63   Pulse 84   Ht 5\' 5"  (1.651 m)   Wt 143 lb 8 oz (65.1 kg)   BMI 23.88 kg/m    CONSTITUTIONAL: Well-developed, well-nourished female in no acute distress.   ABDOMEN: Soft, non distended; Non tender.  No Organomegaly. No CVAT  PELVIC:  External Genitalia: Normal  Vagina: Normal, NuSwab collected  Assessment:   1. Dysuria  - POCT urinalysis dipstick  2. Vaginal itching  - Urine Culture - NuSwab Vaginitis (VG)   Plan:   Labs: Urine culture and NuSwab, see orders. Will contact patient with results.   Discussed home treatment measures.   Reviewed red flag symptoms and when to call.   RTC as needed.    Gunnar BullaJenkins Michelle Lawhorn, CNM [facility], [facility]

## 2019-04-21 ENCOUNTER — Encounter: Payer: BLUE CROSS/BLUE SHIELD | Admitting: Family

## 2019-05-26 ENCOUNTER — Encounter: Payer: Self-pay | Admitting: Family

## 2019-05-26 ENCOUNTER — Other Ambulatory Visit: Payer: Self-pay

## 2019-05-26 NOTE — Progress Notes (Unsigned)
Subjective:    Patient ID: CICLEY GANESH, female    DOB: 1981-08-12, 38 y.o.   MRN: [id number]  CC: BINTA STATZER is a 38 y.o. female who presents today for physical exam.    HPI: HPI  Colorectal Cancer Screening: UTD *** Breast Cancer Screening: Mammogram UTD*** Cervical Cancer Screening: UTD*** Bone Health screening/DEXA for 65+: No increased fracture risk. Defer screening at this time.*** Lung Cancer Screening: Doesn't have 30 year pack year history and age > 23 years.*** Immunizations       Tetanus -         Pneumococcal - Candidate for. *** Hepatitis C screening - Candidate for*** HIV Screening- Candidate for *** Labs: Screening labs today. Exercise: Gets regular exercise.  Alcohol use: *** Smoking/tobacco use: Nonsmoker.  Regular dental exams: In need of dental exam.*** Wears seat belt: Yes.  HISTORY:  No past medical history on file.  Past Surgical History:  Procedure Laterality Date  . CESAREAN SECTION N/A 01/21/2014   Procedure:    STAT CESAREAN SECTION;  Surgeon: Delice Lesch, MD;  Location: [facility];  Service: Obstetrics;  Laterality: N/A;   Family History  Problem Relation Age of Onset  . Hypertension Father   . Heart disease Father   . Ovarian cancer Maternal Grandmother 6  . Stroke Paternal Grandfather   . Heart disease Paternal Grandfather   . Bladder Cancer Maternal Uncle 32  . Heart disease Paternal Grandmother   . Breast cancer Neg Hx       ALLERGIES: Pollen extract  Current Outpatient Medications on File Prior to Visit  Medication Sig Dispense Refill  . levonorgestrel (MIRENA) 20 MCG/24HR IUD 1 Intra Uterine Device (1 each total) by Intrauterine route once. 1 each 0  . levonorgestrel (MIRENA) 20 MCG/24HR IUD 1 each by Intrauterine route once.    . Prenatal Vit-Fe Fumarate-FA (PRENATAL MULTIVITAMIN) TABS tablet Take 1 tablet by mouth daily at 12 noon.    . Vitamin D, Ergocalciferol, (DRISDOL) 50000 units CAPS capsule Take 1 capsule  (50,000 Units total) by mouth 2 (two) times a week. 30 capsule 2   No current facility-administered medications on file prior to visit.     Social History   Tobacco Use  . Smoking status: Never Smoker  . Smokeless tobacco: Never Used  Substance Use Topics  . Alcohol use: Yes    Alcohol/week: 1.0 standard drinks    Types: 1 Standard drinks or equivalent per week  . Drug use: No    Review of Systems    Objective:    There were no vitals taken for this visit.  BP Readings from Last 3 Encounters:  01/01/18 136/63  03/26/17 128/80  12/27/16 128/84   Wt Readings from Last 3 Encounters:  01/01/18 143 lb 8 oz (65.1 kg)  03/26/17 142 lb 9.6 oz (64.7 kg)  12/27/16 145 lb (65.8 kg)    Physical Exam     Assessment & Plan:   Problem List Items Addressed This Visit    None       I am having Alphonsus Sias maintain her prenatal multivitamin, levonorgestrel, levonorgestrel, and Vitamin D (Ergocalciferol).   No orders of the defined types were placed in this encounter.   Return precautions given.   Risks, benefits, and alternatives of the medications and treatment plan prescribed today were discussed, and patient expressed understanding.   Education regarding symptom management and diagnosis given to patient on AVS.   Continue to follow with Mable Paris  G, FNP for routine health maintenance.   Earvin Hansenuth R Kasparian and I agreed with plan.   Rennie PlowmanMargaret , FNP

## 2019-06-11 ENCOUNTER — Other Ambulatory Visit: Payer: Self-pay

## 2019-06-13 ENCOUNTER — Other Ambulatory Visit: Payer: Self-pay

## 2019-06-13 ENCOUNTER — Encounter: Payer: Self-pay | Admitting: Family

## 2019-06-13 ENCOUNTER — Ambulatory Visit (INDEPENDENT_AMBULATORY_CARE_PROVIDER_SITE_OTHER): Payer: PRIVATE HEALTH INSURANCE | Admitting: Family

## 2019-06-13 VITALS — BP 116/60 | HR 92 | Temp 97.4°F | Ht 66.0 in | Wt 154.6 lb

## 2019-06-13 DIAGNOSIS — Z0000 Encounter for general adult medical examination without abnormal findings: Secondary | ICD-10-CM | POA: Diagnosis not present

## 2019-06-13 LAB — CBC WITH DIFFERENTIAL/PLATELET
Basophils Absolute: 0 10*3/uL (ref 0.0–0.1)
Basophils Relative: 0.5 % (ref 0.0–3.0)
Eosinophils Absolute: 0.3 10*3/uL (ref 0.0–0.7)
Eosinophils Relative: 3.2 % (ref 0.0–5.0)
HCT: 40.7 % (ref 36.0–46.0)
Hemoglobin: 13.8 g/dL (ref 12.0–15.0)
Lymphocytes Relative: 30.3 % (ref 12.0–46.0)
Lymphs Abs: 2.4 10*3/uL (ref 0.7–4.0)
MCHC: 33.8 g/dL (ref 30.0–36.0)
MCV: 87.6 fl (ref 78.0–100.0)
Monocytes Absolute: 0.7 10*3/uL (ref 0.1–1.0)
Monocytes Relative: 8.8 % (ref 3.0–12.0)
Neutro Abs: 4.5 10*3/uL (ref 1.4–7.7)
Neutrophils Relative %: 57.2 % (ref 43.0–77.0)
Platelets: 291 10*3/uL (ref 150.0–400.0)
RBC: 4.65 Mil/uL (ref 3.87–5.11)
RDW: 13 % (ref 11.5–15.5)
WBC: 7.9 10*3/uL (ref 4.0–10.5)

## 2019-06-13 LAB — LIPID PANEL
Cholesterol: 207 mg/dL — ABNORMAL HIGH (ref 0–200)
HDL: 62.2 mg/dL (ref >39.00)
LDL Cholesterol: 133 mg/dL — ABNORMAL HIGH (ref 0–99)
NonHDL: 144.8
Total CHOL/HDL Ratio: 3
Triglycerides: 57 mg/dL (ref 0.0–149.0)
VLDL: 11.4 mg/dL (ref 0.0–40.0)

## 2019-06-13 LAB — COMPREHENSIVE METABOLIC PANEL
ALT: 12 U/L (ref 0–35)
AST: 15 U/L (ref 0–37)
Albumin: 4.5 g/dL (ref 3.5–5.2)
Alkaline Phosphatase: 52 U/L (ref 39–117)
BUN: 11 mg/dL (ref 6–23)
CO2: 29 mEq/L (ref 19–32)
Calcium: 9.1 mg/dL (ref 8.4–10.5)
Chloride: 104 mEq/L (ref 96–112)
Creatinine, Ser: 0.7 mg/dL (ref 0.40–1.20)
GFR: 93.65 mL/min (ref >60.00)
Glucose, Bld: 97 mg/dL (ref 70–99)
Potassium: 4.3 mEq/L (ref 3.5–5.1)
Sodium: 137 mEq/L (ref 135–145)
Total Bilirubin: 0.5 mg/dL (ref 0.2–1.2)
Total Protein: 6.6 g/dL (ref 6.0–8.3)

## 2019-06-13 LAB — T4, FREE: Free T4: 0.93 ng/dL (ref 0.60–1.60)

## 2019-06-13 LAB — T3, FREE: T3, Free: 3.9 pg/mL (ref 2.3–4.2)

## 2019-06-13 LAB — HEMOGLOBIN A1C: Hgb A1c MFr Bld: 5.5 % (ref 4.6–6.5)

## 2019-06-13 LAB — TSH: TSH: 1.44 u[IU]/mL (ref 0.35–4.50)

## 2019-06-13 LAB — VITAMIN D 25 HYDROXY (VIT D DEFICIENCY, FRACTURES): VITD: 34.14 ng/mL (ref 30.00–100.00)

## 2019-06-13 NOTE — Patient Instructions (Signed)
Please call call and schedule your 3D mammogram FOR BASELINE as discussed. Please check cost with insurance before hand.    Koppel  Hanska Yell, [place]   Let me know about genetic and if ever you want to pursue this consult  Stay safe!   Health Maintenance, Female Adopting a healthy lifestyle and getting preventive care are important in promoting health and wellness. Ask your health care provider about:  The right schedule for you to have regular tests and exams.  Things you can do on your own to prevent diseases and keep yourself healthy. What should I know about diet, weight, and exercise? Eat a healthy diet   Eat a diet that includes plenty of vegetables, fruits, low-fat dairy products, and lean protein.  Do not eat a lot of foods that are high in solid fats, added sugars, or sodium. Maintain a healthy weight Body mass index (BMI) is used to identify weight problems. It estimates body fat based on height and weight. Your health care provider can help determine your BMI and help you achieve or maintain a healthy weight. Get regular exercise Get regular exercise. This is one of the most important things you can do for your health. Most adults should:  Exercise for at least 150 minutes each week. The exercise should increase your heart rate and make you sweat (moderate-intensity exercise).  Do strengthening exercises at least twice a week. This is in addition to the moderate-intensity exercise.  Spend less time sitting. Even light physical activity can be beneficial. Watch cholesterol and blood lipids Have your blood tested for lipids and cholesterol at 38 years of age, then have this test every 5 years. Have your cholesterol levels checked more often if:  Your lipid or cholesterol levels are high.  You are older than 38 years of age.  You are at high risk for heart disease. What should I know about cancer screening?  Depending on your health history and family history, you may need to have cancer screening at various ages. This may include screening for:  Breast cancer.  Cervical cancer.  Colorectal cancer.  Skin cancer.  Lung cancer. What should I know about heart disease, diabetes, and high blood pressure? Blood pressure and heart disease  High blood pressure causes heart disease and increases the risk of stroke. This is more likely to develop in people who have high blood pressure readings, are of African descent, or are overweight.  Have your blood pressure checked: ? Every 3-5 years if you are 74-10 years of age. ? Every year if you are 62 years old or older. Diabetes Have regular diabetes screenings. This checks your fasting blood sugar level. Have the screening done:  Once every three years after age 20 if you are at a normal weight and have a low risk for diabetes.  More often and at a younger age if you are overweight or have a high risk for diabetes. What should I know about preventing infection? Hepatitis B If you have a higher risk for hepatitis B, you should be screened for this virus. Talk with your health care provider to find out if you are at risk for hepatitis B infection. Hepatitis C Testing is recommended for:  Everyone born from [birth year] through 1965.  Anyone with known risk factors for hepatitis C. Sexually transmitted infections (STIs)  Get screened for STIs, including gonorrhea and chlamydia, if: ? You are sexually active and are younger than  38 years of age. ? You are older than 38 years of age and your health care provider tells you that you are at risk for this type of infection. ? Your sexual activity has changed since you were last screened, and you are at increased risk for chlamydia or gonorrhea. Ask your health care provider if you are at risk.  Ask your health care provider about whether you are at high risk for HIV. Your health care provider may recommend a  prescription medicine to help prevent HIV infection. If you choose to take medicine to prevent HIV, you should first get tested for HIV. You should then be tested every 3 months for as long as you are taking the medicine. Pregnancy  If you are about to stop having your period (premenopausal) and you may become pregnant, seek counseling before you get pregnant.  Take 400 to 800 micrograms (mcg) of folic acid every day if you become pregnant.  Ask for birth control (contraception) if you want to prevent pregnancy. Osteoporosis and menopause Osteoporosis is a disease in which the bones lose minerals and strength with aging. This can result in bone fractures. If you are 38 years old or older, or if you are at risk for osteoporosis and fractures, ask your health care provider if you should:  Be screened for bone loss.  Take a calcium or vitamin D supplement to lower your risk of fractures.  Be given hormone replacement therapy (HRT) to treat symptoms of menopause. Follow these instructions at home: Lifestyle  Do not use any products that contain nicotine or tobacco, such as cigarettes, e-cigarettes, and chewing tobacco. If you need help quitting, ask your health care provider.  Do not use street drugs.  Do not share needles.  Ask your health care provider for help if you need support or information about quitting drugs. Alcohol use  Do not drink alcohol if: ? Your health care provider tells you not to drink. ? You are pregnant, may be pregnant, or are planning to become pregnant.  If you drink alcohol: ? Limit how much you use to 0-1 drink a day. ? Limit intake if you are breastfeeding.  Be aware of how much alcohol is in your drink. In the [place] one drink equals one 12 oz bottle of beer (355 mL), one 5 oz glass of wine (148 mL), or one 1 oz glass of hard liquor (44 mL). General instructions  Schedule regular health, dental, and eye exams.  Stay current with your vaccines.   Tell your health care provider if: ? You often feel depressed. ? You have ever been abused or do not feel safe at home. Summary  Adopting a healthy lifestyle and getting preventive care are important in promoting health and wellness.  Follow your health care provider's instructions about healthy diet, exercising, and getting tested or screened for diseases.  Follow your health care provider's instructions on monitoring your cholesterol and blood pressure. This information is not intended to replace advice given to you by your health care provider. Make sure you discuss any questions you have with your health care provider. Document Released: 04/24/2011 Document Revised: 10/02/2018 Document Reviewed: 10/02/2018 Elsevier Patient Education  2020 [employer]

## 2019-06-13 NOTE — Progress Notes (Signed)
Subjective:    Patient ID: Diana Stanley, female    DOB: September 17, 1981, 38 y.o.   MRN: [id number]  CC: Diana Stanley is a 38 y.o. female who presents today for physical exam.    HPI: Feels well today No complaints.  Coping well with pandemic. Working PRN as Therapist, sports.     Colorectal Cancer Screening: No family h/o colon cancer.  Breast Cancer Screening: Mammogram due for baseline. No breast cancer h/o in family.  Cervical Cancer Screening: follows with GYN, Diana Stanley.         Tetanus - utd         Labs: Screening labs today. Exercise: Gets regular exercise.  Alcohol use: occasional Smoking/tobacco use: Nonsmoker.  Wears seat belt: Yes. Skin: follows with Dr Diana Stanley  HISTORY:  History reviewed. No pertinent past medical history.  Past Surgical History:  Procedure Laterality Date   CESAREAN SECTION N/A 01/21/2014   Procedure:    STAT CESAREAN SECTION;  Surgeon: Diana Lesch, MD;  Location: [facility];  Service: Obstetrics;  Laterality: N/A;   Family History  Problem Relation Age of Onset   Hypertension Father    Heart disease Father    Ovarian cancer Maternal Grandmother 2   Stroke Paternal Grandfather    Heart disease Paternal Grandfather    Bladder Cancer Maternal Uncle 54   Heart disease Paternal Grandmother    Breast cancer Neg Hx       ALLERGIES: Pollen extract  Current Outpatient Medications on File Prior to Visit  Medication Sig Dispense Refill   levonorgestrel (MIRENA) 20 MCG/24HR IUD 1 each by Intrauterine route once.     Prenatal Vit-Fe Fumarate-FA (PRENATAL MULTIVITAMIN) TABS tablet Take 1 tablet by mouth daily at 12 noon.     Vitamin D, Ergocalciferol, (DRISDOL) 50000 units CAPS capsule Take 1 capsule (50,000 Units total) by mouth 2 (two) times a week. 30 capsule 2   levonorgestrel (MIRENA) 20 MCG/24HR IUD 1 Intra Uterine Device (1 each total) by Intrauterine route once. 1 each 0   No current facility-administered medications on file prior  to visit.     Social History   Tobacco Use   Smoking status: Never Smoker   Smokeless tobacco: Never Used  Substance Use Topics   Alcohol use: Yes    Alcohol/week: 1.0 standard drinks    Types: 1 Standard drinks or equivalent per week   Drug use: No    Review of Systems  Constitutional: Negative for chills, fever and unexpected weight change.  HENT: Negative for congestion and trouble swallowing.   Respiratory: Negative for cough.   Cardiovascular: Negative for chest pain, palpitations and leg swelling.  Gastrointestinal: Negative for nausea and vomiting.  Musculoskeletal: Negative for arthralgias and myalgias.  Skin: Negative for rash.  Neurological: Negative for headaches.  Hematological: Negative for adenopathy.  Psychiatric/Behavioral: Negative for confusion.      Objective:    BP 116/60    Pulse 92    Temp (!) 97.4 F (36.3 C) (Temporal)    Ht 5\' 6"  (1.676 m)    Wt 154 lb 9.6 oz (70.1 kg)    SpO2 97%    BMI 24.95 kg/m   BP Readings from Last 3 Encounters:  06/13/19 116/60  01/01/18 136/63  03/26/17 128/80   Wt Readings from Last 3 Encounters:  06/13/19 154 lb 9.6 oz (70.1 kg)  01/01/18 143 lb 8 oz (65.1 kg)  03/26/17 142 lb 9.6 oz (64.7 kg)    Physical Exam  Vitals signs reviewed.  Constitutional:      Appearance: She is well-developed.  Eyes:     Conjunctiva/sclera: Conjunctivae normal.  Neck:     Thyroid: No thyroid mass or thyromegaly.  Cardiovascular:     Rate and Rhythm: Normal rate and regular rhythm.     Pulses: Normal pulses.     Heart sounds: Normal heart sounds.  Pulmonary:     Effort: Pulmonary effort is normal.     Breath sounds: Normal breath sounds. No wheezing, rhonchi or rales.  Chest:     Breasts: Breasts are symmetrical.        Right: No inverted nipple, mass, nipple discharge, skin change or tenderness.        Left: No inverted nipple, mass, nipple discharge, skin change or tenderness.  Lymphadenopathy:     Head:     Right  side of head: No submental, submandibular, tonsillar, preauricular, posterior auricular or occipital adenopathy.     Left side of head: No submental, submandibular, tonsillar, preauricular, posterior auricular or occipital adenopathy.     Cervical: No cervical adenopathy.     Right cervical: No superficial, deep or posterior cervical adenopathy.    Left cervical: No superficial, deep or posterior cervical adenopathy.  Skin:    General: Skin is warm and dry.  Neurological:     Mental Status: She is alert.  Psychiatric:        Speech: Speech normal.        Behavior: Behavior normal.        Thought Content: Thought content normal.        Assessment & Stanley:   Problem List Items Addressed This Visit      Other   Routine physical examination - Primary    CBE performed.  Deferred pelvic exam as Pap smear is UTD and she had no pelvic complaints today.  Declines genetics consult for [facility] ovarian cancer; she will let me know.  Baseline mammogram ordered; patient will schedule.      Relevant Orders   TSH   CBC with Differential/Platelet   Comprehensive metabolic panel   Hemoglobin A1c   Lipid panel   VITAMIN D 25 Hydroxy (Vit-D Deficiency, Fractures)   T3, free   T4, free   MM 3D SCREEN BREAST BILATERAL       I am having Diana Stanley maintain her prenatal multivitamin, levonorgestrel, levonorgestrel, and Vitamin D (Ergocalciferol).   No orders of the defined types were placed in this encounter.   Return precautions given.   Risks, benefits, and alternatives of the medications and treatment Stanley prescribed today were discussed, and patient expressed understanding.   Education regarding symptom management and diagnosis given to patient on AVS.   Continue to follow with Diana Stanley, Diana Stanley for routine health maintenance.   Diana Stanley.   Diana Stanley, Diana Stanley

## 2019-06-13 NOTE — Assessment & Plan Note (Addendum)
CBE performed.  Deferred pelvic exam as Pap smear is UTD and she had no pelvic complaints today.  Declines genetics consult for [facility] ovarian cancer; she will let me know.  Baseline mammogram ordered; patient will schedule.

## 2019-07-04 ENCOUNTER — Encounter: Payer: Self-pay | Admitting: Family

## 2019-09-05 ENCOUNTER — Ambulatory Visit
Admission: RE | Admit: 2019-09-05 | Discharge: 2019-09-05 | Disposition: A | Discharge status: Home / Self Care | Payer: No Typology Code available for payment source | Source: Ambulatory Visit | Attending: Family | Admitting: Family

## 2019-09-05 DIAGNOSIS — Z1231 Encounter for screening mammogram for malignant neoplasm of breast: Secondary | ICD-10-CM | POA: Insufficient documentation

## 2019-09-05 DIAGNOSIS — Z0000 Encounter for general adult medical examination without abnormal findings: Secondary | ICD-10-CM | POA: Diagnosis present

## 2019-10-14 ENCOUNTER — Ambulatory Visit: Payer: No Typology Code available for payment source | Attending: Internal Medicine

## 2019-10-14 DIAGNOSIS — Z20822 Contact with and (suspected) exposure to covid-19: Secondary | ICD-10-CM

## 2019-10-16 LAB — NOVEL CORONAVIRUS, NAA: SARS-CoV-2, NAA: NOT DETECTED

## 2020-03-23 ENCOUNTER — Encounter: Payer: Self-pay | Admitting: Family

## 2020-03-23 ENCOUNTER — Ambulatory Visit (INDEPENDENT_AMBULATORY_CARE_PROVIDER_SITE_OTHER): Payer: No Typology Code available for payment source | Admitting: Family

## 2020-03-23 ENCOUNTER — Other Ambulatory Visit: Payer: Self-pay

## 2020-03-23 VITALS — BP 116/80 | HR 94 | Temp 96.5°F | Ht 65.0 in | Wt 156.2 lb

## 2020-03-23 DIAGNOSIS — Z114 Encounter for screening for human immunodeficiency virus [HIV]: Secondary | ICD-10-CM | POA: Insufficient documentation

## 2020-03-23 NOTE — Progress Notes (Signed)
Subjective:    Patient ID: Diana Stanley, female    DOB: [birth year], 39 y.o.   MRN: [id number]  CC: Diana Stanley is a 39 y.o. female who presents today for follow up.   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 She has form that she not have communicable diseases.   Feels well. No concerns  No fever, weight loss, night sweats, cough, rashes.No recent travel.   No h/o hsv  Follows with Health at work, screened with quantiferon gold. No TB contacts. No IV drug use. Doesn't work with homeless population.   Works as Therapist, sports in ED.     HISTORY:  History reviewed. No pertinent past medical history. Past Surgical History:  Procedure Laterality Date  . CESAREAN SECTION N/A 01/21/2014   Procedure:    STAT CESAREAN SECTION;  Surgeon: Delice Lesch, MD;  Location: [facility];  Service: Obstetrics;  Laterality: N/A;   Family History  Problem Relation Age of Onset  . Hypertension Father   . Heart disease Father   . Ovarian cancer Maternal Grandmother 15  . Stroke Paternal Grandfather   . Heart disease Paternal Grandfather   . Bladder Cancer Maternal Uncle 74  . Heart disease Paternal Grandmother   . Breast cancer Neg Hx     Allergies: Pollen extract Current Outpatient Medications on File Prior to Visit  Medication Sig Dispense Refill  . Prenatal Vit-Fe Fumarate-FA (PRENATAL MULTIVITAMIN) TABS tablet Take 1 tablet by mouth daily at 12 noon.    Marland Kitchen levonorgestrel (MIRENA) 20 MCG/24HR IUD 1 Intra Uterine Device (1 each total) by Intrauterine route once. 1 each 0   No current facility-administered medications on file prior to visit.    Social History   Tobacco Use  . Smoking status: Never Smoker  . Smokeless tobacco: Never Used  Substance Use Topics  . Alcohol use: Yes    Alcohol/week: 1.0 standard drinks    Types: 1 Standard drinks or equivalent per week  . Drug use: No    Review of Systems  Constitutional: Negative for chills,  diaphoresis, fever and unexpected weight change.  Respiratory: Negative for cough.   Cardiovascular: Negative for chest pain and palpitations.  Gastrointestinal: Negative for nausea and vomiting.  Skin: Negative for rash.      Objective:    BP 116/80   Pulse 94   Temp (!) 96.5 F (35.8 C) (Temporal)   Ht _0  (1.651 m)   Wt 156 lb 3.2 oz (70.9 kg)   SpO2 97%   BMI 25.99 kg/m  BP Readings from Last 3 Encounters:  03/23/20 116/80  06/13/19 116/60  01/01/18 136/63   Wt Readings from Last 3 Encounters:  03/23/20 156 lb 3.2 oz (70.9 kg)  06/13/19 154 lb 9.6 oz (70.1 kg)  01/01/18 143 lb 8 oz (65.1 kg)    Physical Exam Vitals reviewed.  Constitutional:      Appearance: She is well-developed.  Eyes:     Conjunctiva/sclera: Conjunctivae normal.  Cardiovascular:     Rate and Rhythm: Normal rate and regular rhythm.     Pulses: Normal pulses.     Heart sounds: Normal heart sounds.  Pulmonary:     Effort: Pulmonary effort is normal.     Breath sounds: Normal breath sounds. No wheezing, rhonchi or rales.  Skin:    General: Skin is warm and dry.  Neurological:     Mental Status: She is alert.  Psychiatric:  Speech: Speech normal.        Behavior: Behavior normal.        Thought Content: Thought content normal.        Assessment & Plan:   Problem List Items Addressed This Visit      Other   Screening for HIV (human immunodeficiency virus) - Primary    Asymptomatic. 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 Reviewed labs and during pregnancy, she appeared non immune to MMR, varicella. Looking at those again to see if she would require booster.       Relevant Orders   Measles/Mumps/Rubella Immunity   Varicella Zoster Abs, IgG/IgM   Hepatitis C antibody   HIV Antibody (routine testing w rflx)       I have discontinued Theotis Barrio Remick's Vitamin D (Ergocalciferol). I am also having her maintain her prenatal  multivitamin and levonorgestrel.   No orders of the defined types were placed in this encounter.   Return precautions given.   Risks, benefits, and alternatives of the medications and treatment plan prescribed today were discussed, and patient expressed understanding.   Education regarding symptom management and diagnosis given to patient on AVS.  Continue to follow with Burnard Hawthorne, FNP for routine health maintenance.   Alphonsus Sias and I agreed with plan.   Mable Paris, FNP

## 2020-03-23 NOTE — Patient Instructions (Signed)
Nice to see you!   

## 2020-03-23 NOTE — Assessment & Plan Note (Signed)
Asymptomatic. 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 Reviewed labs and during pregnancy, she appeared non immune to MMR, varicella. Looking at those again to see if she would require booster.

## 2020-03-24 LAB — MEASLES/MUMPS/RUBELLA IMMUNITY
Mumps IgG: 79.1 AU/mL
Rubella: 4.6 Index
Rubeola IgG: >300 AU/mL

## 2020-03-24 LAB — HEPATITIS C ANTIBODY
Hepatitis C Ab: NONREACTIVE
SIGNAL TO CUT-OFF: 0.01 (ref <1.00)

## 2020-03-24 LAB — VARICELLA ZOSTER ABS, IGG/IGM
Varicella IgM: <0.91 index (ref 0.00–0.90)
Varicella zoster IgG: 820 index (ref >165)

## 2020-03-24 LAB — HIV ANTIBODY (ROUTINE TESTING W REFLEX): HIV 1&2 Ab, 4th Generation: NONREACTIVE

## 2020-04-23 ENCOUNTER — Telehealth: Payer: Self-pay | Admitting: Family

## 2020-04-23 NOTE — Telephone Encounter (Signed)
Letter has been mailed.

## 2020-04-23 NOTE — Telephone Encounter (Signed)
Pt needs lab results from 6/1/21and immunization records  Mailed to her  [address] [place], [place] [address]

## 2021-07-17 IMAGING — MG DIGITAL SCREENING BILAT W/ TOMO W/ CAD
8 series · 8 of 24 positions shown · non-contrast
Comparison: None.

CLINICAL DATA: Screening.

EXAM:
DIGITAL SCREENING BILATERAL MAMMOGRAM WITH TOMO AND CAD

[R CC synth-2D]
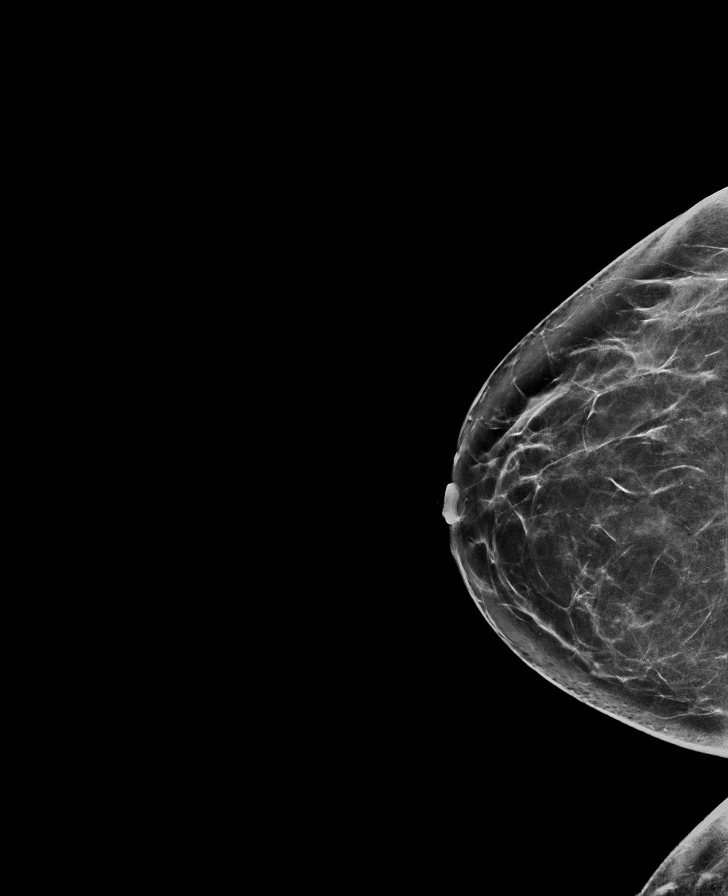

[L CC synth-2D]
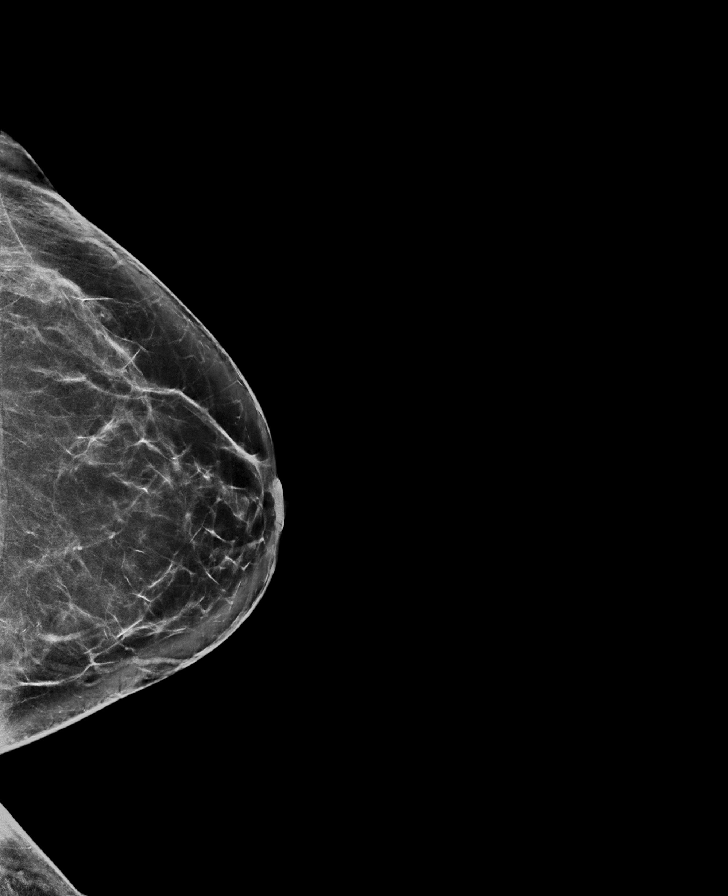

[L MLO synth-2D]
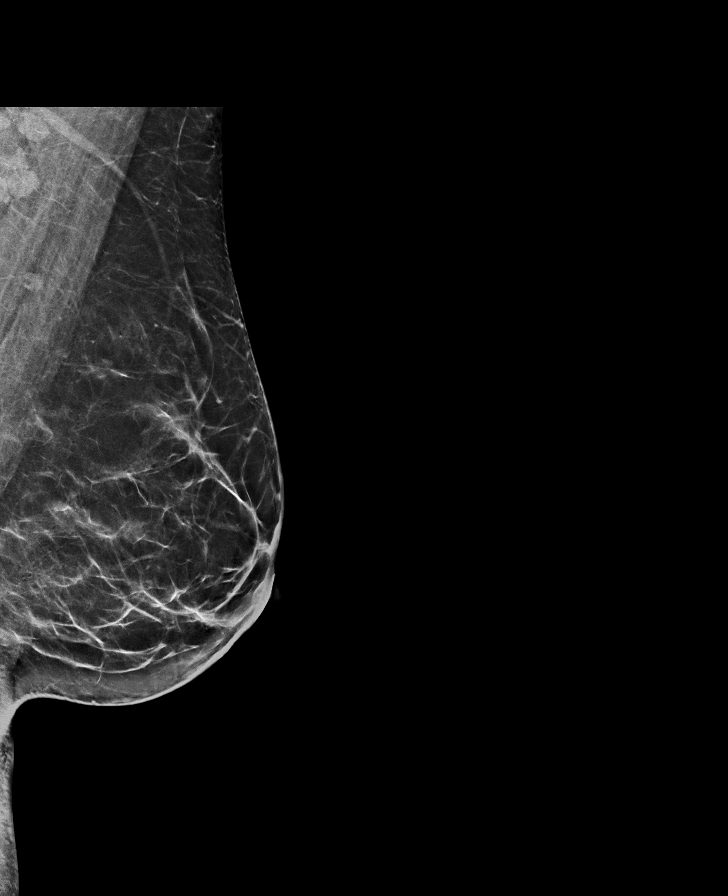

[R MLO synth-2D]
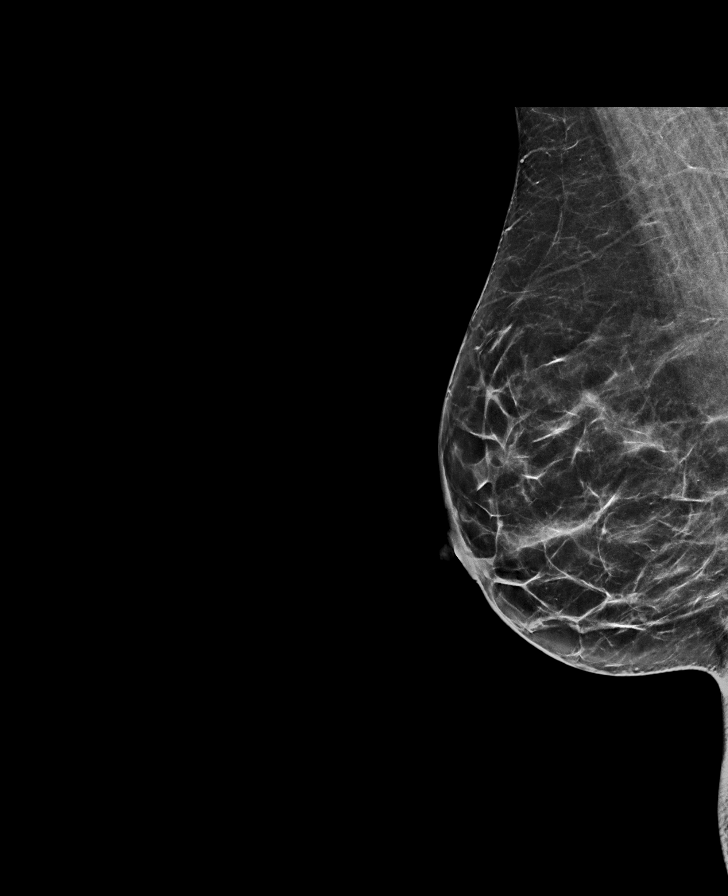

[R MLO tomo · tomo slice 35/70.0]
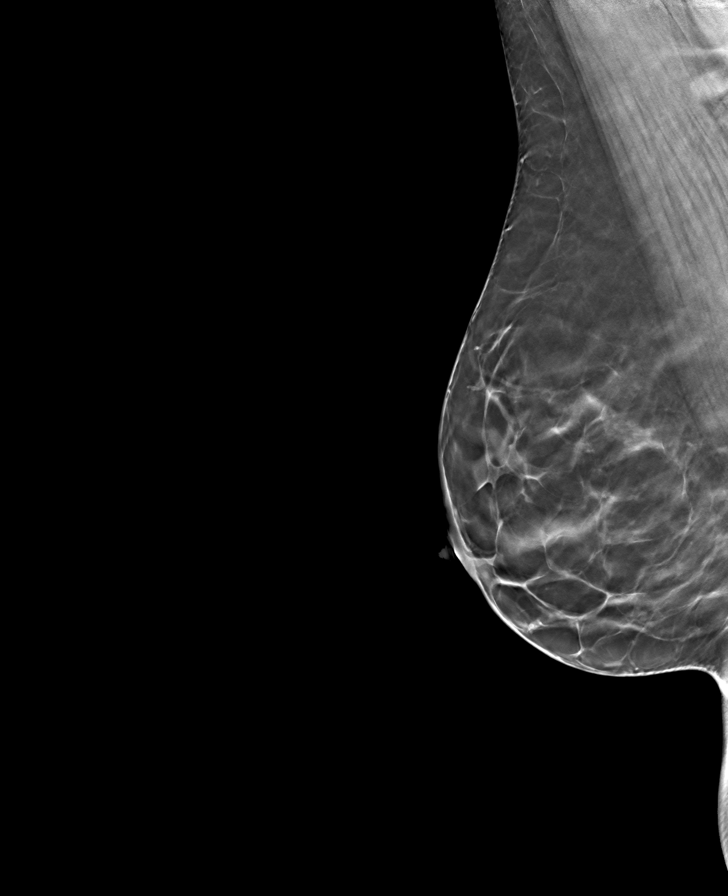

[L MLO tomo · tomo slice 38/75.0]
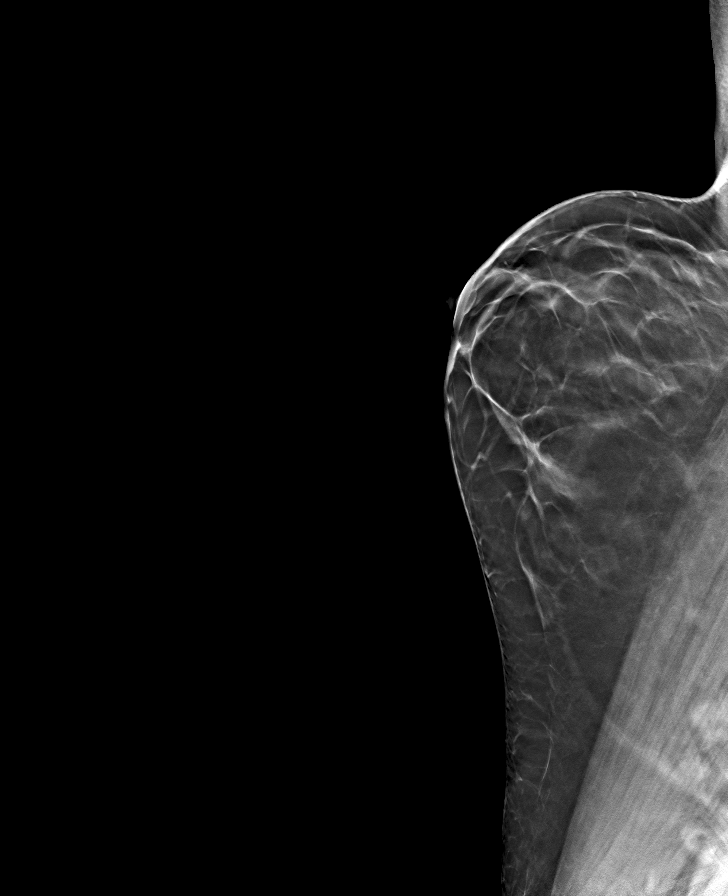

[L CC tomo · tomo slice 41/80.0]
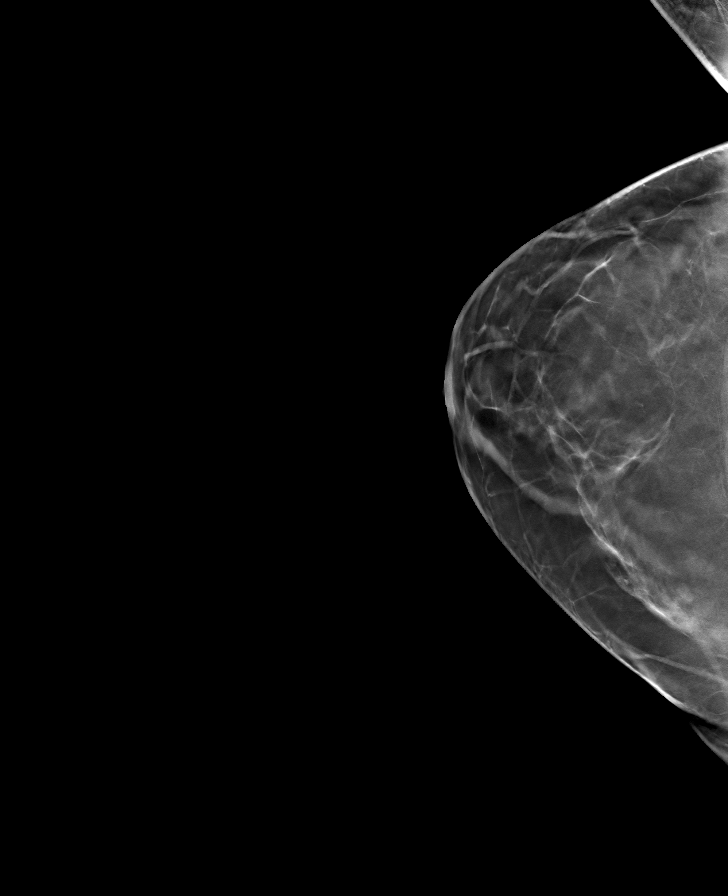

[R CC tomo · tomo slice 39/78.0]
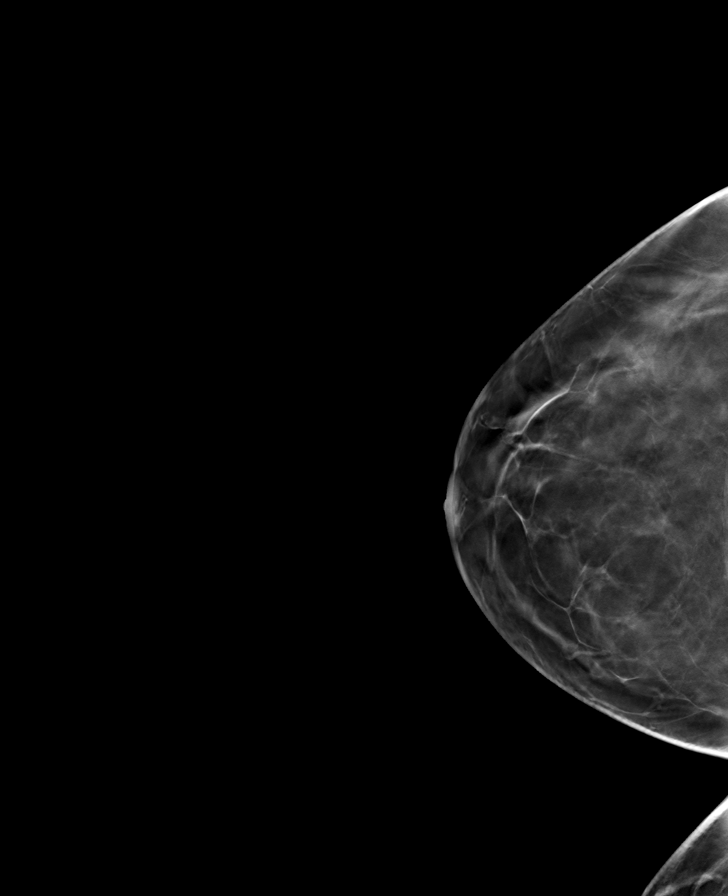

[8 of 24 positions shown; findings below may reference images not displayed]

ACR Breast Density Category b: There are scattered areas of
fibroglandular density.
FINDINGS: There are no findings suspicious for malignancy. Images were
processed with CAD.
IMPRESSION: No mammographic evidence of malignancy. A result letter of this
screening mammogram will be mailed directly to the patient.

RECOMMENDATION:
Screening mammogram at age 40. (Code:TW-R-FEN)

BI-RADS CATEGORY  1: Negative.
